# Patient Record
Sex: Female | Born: 1990 | Race: White | Hispanic: No | Marital: Single | Smoking: Never smoker
Health system: Southern US, Community
[De-identification: ages and names within clinical notes are randomized; demographics above are authoritative.]

## PROBLEM LIST (undated history)

## (undated) DIAGNOSIS — T7840XA Allergy, unspecified, initial encounter: Secondary | ICD-10-CM

## (undated) DIAGNOSIS — G43909 Migraine, unspecified, not intractable, without status migrainosus: Secondary | ICD-10-CM

## (undated) HISTORY — DX: Allergy, unspecified, initial encounter: T78.40XA

---

## 2012-07-08 ENCOUNTER — Ambulatory Visit (INDEPENDENT_AMBULATORY_CARE_PROVIDER_SITE_OTHER): Payer: 59 | Admitting: Family Medicine

## 2012-07-08 VITALS — BP 112/78 | HR 111 | Temp 98.5°F | Resp 16 | Ht 68.0 in | Wt 239.2 lb

## 2012-07-08 DIAGNOSIS — S0500XA Injury of conjunctiva and corneal abrasion without foreign body, unspecified eye, initial encounter: Secondary | ICD-10-CM

## 2012-07-08 DIAGNOSIS — S058X9A Other injuries of unspecified eye and orbit, initial encounter: Secondary | ICD-10-CM

## 2012-07-08 MED ORDER — DICLOFENAC SODIUM 0.1 % OP SOLN: 1.0000 [drp] | Freq: Four times a day (QID) | OPHTHALMIC | Status: DC

## 2012-07-08 MED ORDER — CIPROFLOXACIN HCL 0.3 % OP SOLN: 1.0000 [drp] | OPHTHALMIC | Status: DC

## 2012-07-08 NOTE — Progress Notes (Signed)
  Subjective:    Patient ID: Grace Long, female    DOB: September 30, 1990, 21 y.o.   MRN: [id number] Chief Complaint  Patient presents with  . Conjunctivitis    right eye    HPI  Right eye developed pain last night w/ rt frontal HA, has been watering but not draining anything. Did not get anything in eye.  + photophobia, no vision changes.  Does have pain at rest, worse with eye movement and with blinking. Usually wears contacts that she disposes of monthly as instructed.  Sometimes sleeps in them.  Took out her contacts yesterday and hasn't put them in since - wearing glasses.  Past Medical History  Diagnosis Date  . Allergy    Current Outpatient Prescriptions on File Prior to Visit  Medication Sig Dispense Refill  . Norgestimate-Ethinyl Estradiol Triphasic (TRI-SPRINTEC) 0.18/0.215/0.25 MG-35 MCG tablet Take 1 tablet by mouth daily.       Review of Systems  Constitutional: Negative for fever, chills, diaphoresis and activity change.  HENT: Negative for ear pain, congestion, sore throat, rhinorrhea, neck pain, neck stiffness, dental problem, sinus pressure and tinnitus.   Eyes: Positive for photophobia, pain, discharge and redness. Negative for itching and visual disturbance.  Neurological: Positive for headaches. Negative for dizziness, syncope, facial asymmetry, weakness, light-headedness and numbness.  Hematological: Negative for adenopathy.  Psychiatric/Behavioral: Negative for sleep disturbance.      BP 112/78  Pulse 111  Temp 98.5 F (36.9 C) (Oral)  Resp 16  Ht 5\' 8"  (1.727 m)  Wt 239 lb 3.2 oz (108.5 kg)  BMI 36.37 kg/m2  SpO2 98%  LMP 06/24/2012 Objective:   Physical Exam  Constitutional: She is oriented to person, place, and time. She appears well-developed and well-nourished. No distress.  HENT:  Head: Normocephalic and atraumatic.  Right Ear: External ear normal.  Eyes: EOM are normal. Pupils are equal, round, and reactive to light. No foreign bodies found. Right eye  exhibits discharge. Right eye exhibits no chemosis, no exudate and no hordeolum. No foreign body present in the right eye. Left eye exhibits no chemosis, no discharge and no exudate. Right conjunctiva is injected. Right conjunctiva has no hemorrhage. No scleral icterus.  Fundoscopic exam:      The right eye shows no hemorrhage and no papilledema.       The left eye shows no hemorrhage and no papilledema.  Slit lamp exam:      The right eye shows corneal abrasion and fluorescein uptake.       Right eye watery discharge, mild chemosis and injection of conjunctiva around medial and distal iris. Small pinpoint of fluorescein uptake in cornea along outer iris 4pm seen with magnification.  Pulmonary/Chest: Effort normal.  Neurological: She is alert and oriented to person, place, and time.  Skin: Skin is warm and dry. She is not diaphoretic. No erythema.  Psychiatric: She has a normal mood and affect. Her behavior is normal.      Assessment & Plan:   1. Corneal abrasion  ciprofloxacin (CILOXAN) 0.3 % ophthalmic solution, diclofenac (VOLTAREN) 0.1 % ophthalmic solution  No contacts, after healed discard contacts and use new pack.  Recheck in 2d to ensure healing and no ulceration.

## 2012-07-10 ENCOUNTER — Ambulatory Visit (INDEPENDENT_AMBULATORY_CARE_PROVIDER_SITE_OTHER): Payer: 59 | Admitting: Family Medicine

## 2012-07-10 VITALS — BP 100/72 | HR 86 | Temp 98.0°F | Resp 16 | Ht 68.0 in | Wt 238.0 lb

## 2012-07-10 DIAGNOSIS — S0500XA Injury of conjunctiva and corneal abrasion without foreign body, unspecified eye, initial encounter: Secondary | ICD-10-CM

## 2012-07-10 DIAGNOSIS — S058X9A Other injuries of unspecified eye and orbit, initial encounter: Secondary | ICD-10-CM

## 2012-07-10 NOTE — Progress Notes (Signed)
  Subjective:    Patient ID: Grace Long, female    DOB: May 31, 1991, 21 y.o.   MRN: [id number]  HPI  Grace Long is a delightful 21 yo female who I saw 2d prev with a small corneal abrasion.  The pain and discomfort is lessening though she is still having some photophobia.  She also feels like she has some pressure behind her eyes. No vision changes.  The diclofenac drops I gave her feels like it makes the pain worse - burning and more pressure after using them with no eventual improvement but doing fine on the antibiotic drops.  Past Medical History  Diagnosis Date  . Allergy      Review of Systems  Constitutional: Negative for fever, chills, diaphoresis and activity change.  HENT: Negative for ear pain, congestion, sore throat, rhinorrhea, neck pain, neck stiffness, dental problem, sinus pressure and tinnitus.   Eyes: Positive for photophobia, pain and itching. Negative for discharge, redness and visual disturbance.  Skin: Negative for rash.  Neurological: Negative for dizziness, syncope, facial asymmetry, weakness, light-headedness, numbness and headaches.  Hematological: Negative for adenopathy.  Psychiatric/Behavioral: Negative for sleep disturbance.      BP 100/72  Pulse 86  Temp 98 F (36.7 C) (Oral)  Resp 16  Ht 5\' 8"  (1.727 m)  Wt 238 lb (107.956 kg)  BMI 36.19 kg/m2  SpO2 99%  LMP 06/24/2012 Objective:   Physical Exam  Constitutional: She is oriented to person, place, and time. She appears well-developed and well-nourished. No distress.  HENT:  Head: Normocephalic and atraumatic.  Right Ear: External ear normal.  Eyes: EOM are normal. Pupils are equal, round, and reactive to light. Right eye exhibits no chemosis, no discharge and no exudate. Left eye exhibits no chemosis, no discharge and no exudate. Right conjunctiva is not injected. Left conjunctiva is not injected. No scleral icterus.  Fundoscopic exam:      The right eye shows no hemorrhage and no papilledema.   The left eye shows no hemorrhage and no papilledema.         Tiny point of collection on outer ring of iris approx 4 o'clock with very slight area of erythema along the iris from 3 to 6 o'clock. Both MUCH smaller and more subtle than prior exam.  Pulmonary/Chest: Effort normal.  Neurological: She is alert and oriented to person, place, and time.  Skin: Skin is warm and dry. She is not diaphoretic. No erythema.  Psychiatric: She has a normal mood and affect. Her behavior is normal.      Assessment & Plan:   1. Corneal abrasion   Much improved. Continue antibiotic drops for 5 additional days. RTC immed if any sxs persist or worsen. Ok to d/c nsaid drops.

## 2012-11-10 ENCOUNTER — Ambulatory Visit (INDEPENDENT_AMBULATORY_CARE_PROVIDER_SITE_OTHER): Payer: Managed Care, Other (non HMO) | Admitting: Emergency Medicine

## 2012-11-10 VITALS — BP 102/60 | HR 120 | Temp 98.3°F | Resp 20 | Ht 67.0 in | Wt 233.2 lb

## 2012-11-10 DIAGNOSIS — J039 Acute tonsillitis, unspecified: Secondary | ICD-10-CM

## 2012-11-10 MED ORDER — PENICILLIN V POTASSIUM 500 MG PO TABS: 500.0000 mg | ORAL_TABLET | Freq: Four times a day (QID) | ORAL | Status: DC

## 2012-11-10 NOTE — Progress Notes (Signed)
[facility] [address], [place] [address] [phone]  Date:  11/10/2012   Name:  Grace Long   DOB:  September 11, 1990   MRN:  [id number]  PCP:  No PCP Per Patient    Chief Complaint: Sore Throat and Mouth Lesions   History of Present Illness:  Grace Long is a 22 y.o. very pleasant female patient who presents with the following:  Ill since Saturday.  Has an intensely sore throat. Now has sores on the inside of her lips and denies cough or coryza.  No fever or chills.  No rash or nausea or vomiting.  Works in [employer].  No improvement with over the counter medications or other home remedies. Denies other complaint or health concern today.    There is no problem list on file for this patient.   Past Medical History  Diagnosis Date  . Allergy     History reviewed. No pertinent past surgical history.  History  Substance Use Topics  . Smoking status: Never Smoker   . Smokeless tobacco: Not on file  . Alcohol Use: No    Family History  Problem Relation Age of Onset  . Diabetes Maternal Grandmother     No Known Allergies  Medication list has been reviewed and updated.  Current Outpatient Prescriptions on File Prior to Visit  Medication Sig Dispense Refill  . Norgestimate-Ethinyl Estradiol Triphasic (TRI-SPRINTEC) 0.18/0.215/0.25 MG-35 MCG tablet Take 1 tablet by mouth daily.      . ciprofloxacin (CILOXAN) 0.3 % ophthalmic solution Place 1 drop into the right eye every 2 (two) hours. Administer 1 drop, every 2 hours, while awake, for 2 days. Then 1 drop, every 4 hours, while awake, for the next 5 days.  5 mL  0   No current facility-administered medications on file prior to visit.    Review of Systems:  As per HPI, otherwise negative.    Physical Examination: Filed Vitals:   11/10/12 1141  BP: 102/60  Pulse: 120  Temp: 98.3 F (36.8 C)  Resp: 20   Filed Vitals:   11/10/12 1141  Height: 5\' 7"  (1.702 m)  Weight: 233 lb 3.2 oz  (105.779 kg)   Body mass index is 36.52 kg/(m^2). Ideal Body Weight: Weight in (lb) to have BMI = 25: 159.3  GEN: WDWN, NAD, Non-toxic, A & O x 3 HEENT: Atraumatic, Normocephalic. Neck supple. No masses, anterior cervical LAD.  Exudative tonsillitis Ears and Nose: No external deformity.   CV: RRR, No M/G/R. No JVD. No thrill. No extra heart sounds. PULM: CTA B, no wheezes, crackles, rhonchi. No retractions. No resp. distress. No accessory muscle use. ABD: S, NT, ND, +BS. No rebound. No HSM. EXTR: No c/c/e NEURO Normal gait.  PSYCH: Normally interactive. Conversant. Not depressed or anxious appearing.  Calm demeanor.    Assessment and Plan: Tonsillitis Pen v k Follow up as needed   Signed,  Phillips Odor, MD

## 2012-11-10 NOTE — Patient Instructions (Addendum)
Tonsillitis Tonsils are lumps of lymphoid tissues at the back of the throat. Each tonsil has 20 crevices (crypts). Tonsils help fight nose and throat infections and keep infection from spreading to other parts of the body for the first 18 months of life. Tonsillitis is an infection of the throat that causes the tonsils to become red, tender, and swollen. CAUSES Sudden and, if treated, temporary (acute) tonsillitis is usually caused by infection with streptococcal bacteria. Long lasting (chronic) tonsillitis occurs when the crypts of the tonsils become filled with pieces of food and bacteria, which makes it easy for the tonsils to become constantly infected. SYMPTOMS  Symptoms of tonsillitis include:  A sore throat.  White patches on the tonsils.  Fever.  Tiredness. DIAGNOSIS Tonsillitis can be diagnosed through a physical exam. Diagnosis can be confirmed with the results of lab tests, including a throat culture. TREATMENT  The goals of tonsillitis treatment include the reduction of the severity and duration of symptoms, prevention of associated conditions, and prevention of disease transmission. Tonsillitis caused by bacteria can be treated with antibiotics. Usually, treatment with antibiotics is started before the cause of the tonsillitis is known. However, if it is determined that the cause is not bacterial, antibiotics will not treat the tonsillitis. If attacks of tonsillitis are severe and frequent, your caregiver may recommend surgery to remove the tonsils (tonsillectomy). HOME CARE INSTRUCTIONS   Rest as much as possible and get plenty of sleep.  Drink plenty of fluids. While the throat is very sore, eat soft foods or liquids, such as sherbet, soups, or instant breakfast drinks.  Eat frozen ice pops.  Older children and adults may gargle with a warm or cold liquid to help soothe the throat. Mix 1 teaspoon of salt in 1 cup of water.  Other family members who also develop a sore  throat or fever should have a medical exam or throat culture.  Only take over-the-counter or prescription medicines for pain, discomfort, or fever as directed by your caregiver.  If you are given antibiotics, take them as directed. Finish them even if you start to feel better. SEEK MEDICAL CARE IF:   Your baby is older than 3 months with a rectal temperature of 100.5 F (38.1 C) or higher for more than 1 day.  Large, tender lumps develop in your neck.  A rash develops.  Green, yellow-brown, or bloody substance is coughed up.  You are unable to swallow liquids or food for 24 hours.  Your child is unable to swallow food or liquids for 12 hours. SEEK IMMEDIATE MEDICAL CARE IF:   You develop any new symptoms such as vomiting, severe headache, stiff neck, chest pain, or trouble breathing or swallowing.  You have severe throat pain along with drooling or voice changes.  You have severe pain, unrelieved with recommended medications.  You are unable to fully open the mouth.  You develop redness, swelling, or severe pain anywhere in the neck.  You have a fever.  Your baby is older than 3 months with a rectal temperature of 102 F (38.9 C) or higher.  Your baby is 3 months old or younger with a rectal temperature of 100.4 F (38 C) or higher. MAKE SURE YOU:   Understand these instructions.  Will watch your condition.  Will get help right away if you are not doing well or get worse. Document Released: 04/18/2005 Document Revised: 10/01/2011 Document Reviewed: 09/14/2010 Exit[employer] Patient Information 2013 [employer].  

## 2012-11-12 ENCOUNTER — Telehealth: Payer: Self-pay

## 2012-11-12 NOTE — Telephone Encounter (Signed)
Pt was seen on Monday and diagnosed with tonsillitis and now has several sores on and around her mouth, would like to know what this could be from   Best number [phone]

## 2012-11-12 NOTE — Telephone Encounter (Signed)
Spoke with patient and she was diagnosed with toncilittis on Monday.  Now having sores in and around mouth.  Would like to know if this is a side effect of penicillin?  What should she do about them or is there something she can take for them?

## 2012-11-13 ENCOUNTER — Ambulatory Visit (INDEPENDENT_AMBULATORY_CARE_PROVIDER_SITE_OTHER): Payer: Managed Care, Other (non HMO) | Admitting: Physician Assistant

## 2012-11-13 VITALS — BP 103/62 | HR 130 | Temp 98.9°F | Resp 16 | Ht 67.0 in | Wt 229.0 lb

## 2012-11-13 DIAGNOSIS — R21 Rash and other nonspecific skin eruption: Secondary | ICD-10-CM

## 2012-11-13 DIAGNOSIS — K121 Other forms of stomatitis: Secondary | ICD-10-CM

## 2012-11-13 DIAGNOSIS — K1230 Oral mucositis (ulcerative), unspecified: Secondary | ICD-10-CM

## 2012-11-13 LAB — POCT CBC
Granulocyte percent: 60.6 %G (ref 37–80)
HCT, POC: 36 % — AB (ref 37.7–47.9)
Hemoglobin: 10.9 g/dL — AB (ref 12.2–16.2)
Lymph, poc: 2 (ref 0.6–3.4)
MCH, POC: 26.6 pg — AB (ref 27–31.2)
MCHC: 30.3 g/dL — AB (ref 31.8–35.4)
MCV: 87.8 fL (ref 80–97)
MID (cbc): 0.6 (ref 0–0.9)
MPV: 10.7 fL (ref 0–99.8)
POC Granulocyte: 4 (ref 2–6.9)
POC LYMPH PERCENT: 30.4 %L (ref 10–50)
POC MID %: 9 %M (ref 0–12)
Platelet Count, POC: 239 10*3/uL (ref 142–424)
RBC: 4.1 M/uL (ref 4.04–5.48)
RDW, POC: 14 %
WBC: 6.6 10*3/uL (ref 4.6–10.2)

## 2012-11-13 MED ORDER — FIRST-DUKES MOUTHWASH MT SUSP: 5.0000 mL | OROMUCOSAL | Status: DC | PRN

## 2012-11-13 MED ORDER — VALACYCLOVIR HCL 1 G PO TABS: 2000.0000 mg | ORAL_TABLET | Freq: Two times a day (BID) | ORAL | Status: DC

## 2012-11-13 NOTE — Telephone Encounter (Signed)
Called her to advise, she will come in this evening.

## 2012-11-13 NOTE — Telephone Encounter (Signed)
I would recommend a recheck - it could be from a virus or it is possible a reaction to the medication.

## 2012-11-13 NOTE — Progress Notes (Signed)
Subjective:    Patient ID: Grace Long, female    DOB: 03-02-1991, 22 y.o.   MRN: [id number]  HPI 22 year old female presents for recheck of tonsillitis.  Was seen here 11/10/12 and treated for tonsillitis and placed penicillin.  States her throat feels much better but now she has noticed multiple sores in her mouth, lips, and roof of her mouth.  Admits these are extremely uncomfortable and are so painful that she has been unable to eat.  Has taken ibuprofen and tylenol which has not helped at all. Denies fever, chills, nausea, vomiting, nasal congestion, otalgia, or headache. Does admit to neck lymph node swelling and extreme fatigue this week.   Patient is otherwise healthy with no other concerns today  She works at [employer] in [employer].     Review of Systems  Constitutional: Positive for fatigue. Negative for fever and chills.  HENT: Positive for trouble swallowing (painful due to sores). Negative for ear pain, congestion, sore throat, rhinorrhea and postnasal drip.   Respiratory: Negative for cough and shortness of breath.   Gastrointestinal: Negative for nausea, vomiting and abdominal pain.  Skin: Negative for rash.  Neurological: Negative for dizziness and headaches.       Objective:   Physical Exam  Constitutional: She is oriented to person, place, and time. She appears well-developed and well-nourished.  HENT:  Head: Normocephalic and atraumatic.  Right Ear: Hearing, tympanic membrane, external ear and ear canal normal.  Left Ear: Hearing, tympanic membrane, external ear and ear canal normal.  Mouth/Throat: Uvula is midline, oropharynx is clear and moist and mucous membranes are normal. Oral lesions (multiple ulcers; 4 on inside of lower lip, 1 inside upper lip, several on roof of mouth) present.  Eyes: Conjunctivae are normal.  Neck: Normal range of motion. Neck supple.  Cardiovascular: Normal rate.   Pulmonary/Chest: Effort normal.  Lymphadenopathy:    She has  cervical adenopathy (AC).  Neurological: She is alert and oriented to person, place, and time.  Psychiatric: She has a normal mood and affect. Her behavior is normal. Judgment and thought content normal.     Results for orders placed in visit on 11/13/12  POCT CBC      Result Value Range   WBC 6.6  4.6 - 10.2 K/uL   Lymph, poc 2.0  0.6 - 3.4   POC LYMPH PERCENT 30.4  10 - 50 %L   MID (cbc) 0.6  0 - 0.9   POC MID % 9.0  0 - 12 %M   POC Granulocyte 4.0  2 - 6.9   Granulocyte percent 60.6  37 - 80 %G   RBC 4.10  4.04 - 5.48 M/uL   Hemoglobin 10.9 (*) 12.2 - 16.2 g/dL   HCT, POC 69.6 (*) 29.5 - 47.9 %   MCV 87.8  80 - 97 fL   MCH, POC 26.6 (*) 27 - 31.2 pg   MCHC 30.3 (*) 31.8 - 35.4 g/dL   RDW, POC 28.4     Platelet Count, POC 239  142 - 424 K/uL   MPV 10.7  0 - 99.8 fL        Assessment & Plan:  Rash and nonspecific skin eruption - Plan: POCT CBC, Herpes simplex virus culture, HSV(herpes simplex vrs) 1+2 ab-IgG, Diphenhyd-Hydrocort-Nystatin (FIRST-DUKES MOUTHWASH) SUSP, valACYclovir (VALTREX) 1000 MG tablet  Stomatitis  Viral culture and HSV titers pending.  Patient did have a negative HIV test 2 months ago.  Continue penicillin for strep coverage.  Start Valtrex 2000 mg bid x 1 day, then 1000 mg bid until sores resolve Duke's Mouthwash q1-2hours prn pain.   Recommend OTC iron and multivitamin as she is slightly anemic today. She does think she has been told this before Follow up if symptoms worsen or fail to improve.

## 2012-11-16 LAB — HERPES SIMPLEX VIRUS CULTURE: Organism ID, Bacteria: DETECTED

## 2012-11-17 LAB — HSV(HERPES SIMPLEX VRS) I + II AB-IGG
HSV 1 Glycoprotein G Ab, IgG: 0.66 IV
HSV 2 Glycoprotein G Ab, IgG: 0.11 IV

## 2012-11-20 ENCOUNTER — Emergency Department (HOSPITAL_COMMUNITY)
Admission: EM | Admit: 2012-11-20 | Discharge: 2012-11-20 | Disposition: A | Discharge status: Home / Self Care | Payer: Managed Care, Other (non HMO) | Attending: Emergency Medicine | Admitting: Emergency Medicine

## 2012-11-20 ENCOUNTER — Ambulatory Visit: Payer: Managed Care, Other (non HMO)

## 2012-11-20 ENCOUNTER — Encounter (HOSPITAL_COMMUNITY): Payer: Self-pay | Admitting: *Deleted

## 2012-11-20 ENCOUNTER — Emergency Department (HOSPITAL_COMMUNITY): Payer: Managed Care, Other (non HMO)

## 2012-11-20 ENCOUNTER — Ambulatory Visit (INDEPENDENT_AMBULATORY_CARE_PROVIDER_SITE_OTHER): Payer: Managed Care, Other (non HMO) | Admitting: Emergency Medicine

## 2012-11-20 VITALS — BP 111/68 | HR 74 | Temp 98.3°F | Resp 17 | Ht 68.0 in | Wt 232.0 lb

## 2012-11-20 DIAGNOSIS — N201 Calculus of ureter: Secondary | ICD-10-CM | POA: Insufficient documentation

## 2012-11-20 DIAGNOSIS — R319 Hematuria, unspecified: Secondary | ICD-10-CM

## 2012-11-20 DIAGNOSIS — R109 Unspecified abdominal pain: Secondary | ICD-10-CM

## 2012-11-20 DIAGNOSIS — R112 Nausea with vomiting, unspecified: Secondary | ICD-10-CM | POA: Insufficient documentation

## 2012-11-20 LAB — POCT URINALYSIS DIPSTICK
Glucose, UA: NEGATIVE
Ketones, UA: NEGATIVE
Leukocytes, UA: NEGATIVE
Nitrite, UA: NEGATIVE
Protein, UA: 100
Spec Grav, UA: 1.025
Urobilinogen, UA: 0.2
pH, UA: 6

## 2012-11-20 LAB — URINALYSIS, ROUTINE W REFLEX MICROSCOPIC
Glucose, UA: NEGATIVE mg/dL
Leukocytes, UA: NEGATIVE
pH: 6.5 (ref 5.0–8.0)

## 2012-11-20 LAB — URINE MICROSCOPIC-ADD ON

## 2012-11-20 LAB — POCT URINE PREGNANCY: Preg Test, Ur: NEGATIVE

## 2012-11-20 MED ORDER — OXYCODONE-ACETAMINOPHEN 5-325 MG PO TABS: ORAL_TABLET | ORAL | Status: DC

## 2012-11-20 MED ORDER — KETOROLAC TROMETHAMINE 60 MG/2ML IM SOLN
60.0000 mg | Freq: Once | INTRAMUSCULAR | Status: AC
  Administered 2012-11-20: 60 mg via INTRAMUSCULAR

## 2012-11-20 MED ORDER — ONDANSETRON 4 MG PO TBDP
4.0000 mg | ORAL_TABLET | Freq: Once | ORAL | Status: AC
  Administered 2012-11-20: 4 mg via ORAL

## 2012-11-20 MED ORDER — SODIUM CHLORIDE 0.9 % IV SOLN
Freq: Once | INTRAVENOUS | Status: AC
  Administered 2012-11-20: 14:00:00 via INTRAVENOUS

## 2012-11-20 MED ORDER — HYDROCODONE-ACETAMINOPHEN 7.5-325 MG/15ML PO SOLN
15.0000 mL | Freq: Once | ORAL | Status: AC
  Administered 2012-11-20: 15 mL via ORAL

## 2012-11-20 MED ORDER — ONDANSETRON HCL 4 MG/2ML IJ SOLN
4.0000 mg | Freq: Once | INTRAMUSCULAR | Status: AC
  Administered 2012-11-20: 4 mg via INTRAVENOUS
  Filled 2012-11-20: qty 2

## 2012-11-20 MED ORDER — ONDANSETRON 4 MG PO TBDP: 4.0000 mg | ORAL_TABLET | Freq: Three times a day (TID) | ORAL | Status: DC | PRN

## 2012-11-20 MED ORDER — DEXTROSE 5 % IV SOLN
1.0000 g | Freq: Once | INTRAVENOUS | Status: AC
  Administered 2012-11-20: 1 g via INTRAVENOUS
  Filled 2012-11-20: qty 10

## 2012-11-20 MED ORDER — MORPHINE SULFATE 4 MG/ML IJ SOLN
4.0000 mg | Freq: Once | INTRAMUSCULAR | Status: AC
  Administered 2012-11-20: 4 mg via INTRAVENOUS
  Filled 2012-11-20: qty 1

## 2012-11-20 MED ORDER — TAMSULOSIN HCL 0.4 MG PO CAPS: ORAL_CAPSULE | ORAL | Status: DC

## 2012-11-20 MED ORDER — CEPHALEXIN 500 MG PO CAPS: 500.0000 mg | ORAL_CAPSULE | Freq: Three times a day (TID) | ORAL | Status: DC

## 2012-11-20 NOTE — ED Provider Notes (Signed)
History     CSN: [id number]  Arrival date & time 11/20/12  1228   First MD Initiated Contact with Patient 11/20/12 1259      Chief Complaint  Patient presents with  . Back Pain    (Consider location/radiation/quality/duration/timing/severity/associated sxs/prior treatment) HPI Patient reports yesterday after she urinated she saw some blood on the toilet paper when she wiped. She states this morning about 8:30 AM she had acute onset of right flank pain which sometimes radiates into her right lateral abdomen. She states the pain has been constant and is sharp. She states deep breathing makes it hurt more. Nothing makes it feel better. She has had nausea and vomiting. She denies dysuria, frequency, or fever. She does report decreased urinary output. She states she's never had this pain before. Patient states her last normal period started a week ago and she is on birth control pills. Patient was seen at the urgent care on [address]  was given hydrocodone liquid which she vomited. She was also given Toradol 60 mg IM and Zofran ODT. She states it improved her pain from a level 10/10  to a current level 5/10. She states her boyfriend lives in [place] so they came to the emergency department here.  She reports her father had kidney stones.  PCP urgent care on [address]  Past Medical History  Diagnosis Date  . Allergy     History reviewed. No pertinent past surgical history.  Family History  Problem Relation Age of Onset  . Diabetes Maternal Grandmother   FOP kidney stones  History  Substance Use Topics  . Smoking status: Never Smoker   . Smokeless tobacco: Not on file  . Alcohol Use: No   Employed in [place] Lives in [place], boyfriend lives in [place]  [place] History   Grav Para Term Preterm Abortions TAB SAB Ect Mult Living                  Review of Systems  All other systems reviewed and are negative.    Allergies  Review of patient's allergies indicates no  known allergies.  Home Medications   Current Outpatient Rx  Name  Route  Sig  Dispense  Refill  . Norgestimate-Ethinyl Estradiol Triphasic (TRI-SPRINTEC) 0.18/0.215/0.25 MG-35 MCG tablet   Oral   Take 1 tablet by mouth daily.         . SUMAtriptan (IMITREX) 100 MG tablet   Oral   Take 100 mg by mouth every 2 (two) hours as needed for migraine.           BP 102/79  Pulse 67  Temp(Src) 97.2 F (36.2 C) (Oral)  Resp 18  Ht 5\' 8"  (1.727 m)  Wt 232 lb (105.235 kg)  BMI 35.28 kg/m2  SpO2 100%  LMP 11/14/2012  Vital signs normal    Physical Exam  Nursing note and vitals reviewed. Constitutional: She is oriented to person, place, and time. She appears well-developed and well-nourished.  Non-toxic appearance. She does not appear ill.  Appears uncomfortable  HENT:  Head: Normocephalic and atraumatic.  Right Ear: External ear normal.  Left Ear: External ear normal.  Nose: Nose normal. No mucosal edema or rhinorrhea.  Mouth/Throat: Oropharynx is clear and moist and mucous membranes are normal. No dental abscesses or edematous.  Eyes: Conjunctivae and EOM are normal. Pupils are equal, round, and reactive to light.  Neck: Normal range of motion and full passive range of motion without pain. Neck supple.  Cardiovascular: Normal  rate, regular rhythm and normal heart sounds.  Exam reveals no gallop and no friction rub.   No murmur heard. Pulmonary/Chest: Effort normal and breath sounds normal. No respiratory distress. She has no wheezes. She has no rhonchi. She has no rales. She exhibits no tenderness and no crepitus.  Abdominal: Soft. Normal appearance and bowel sounds are normal. She exhibits no distension. There is no tenderness. There is no rebound and no guarding.    Area of pain noted  Musculoskeletal: Normal range of motion. She exhibits no edema and no tenderness.       Back:  Moves all extremities well.   Neurological: She is alert and oriented to person, place, and  time. She has normal strength. No cranial nerve deficit.  Skin: Skin is warm, dry and intact. No rash noted. No erythema. There is pallor.  Psychiatric: She has a normal mood and affect. Her speech is normal and behavior is normal. Her mood appears not anxious.    ED Course  Procedures (including critical care time)  Medications  0.9 %  sodium chloride infusion ( Intravenous Stopped 11/20/12 1639)  morphine 4 MG/ML injection 4 mg (4 mg Intravenous Given 11/20/12 1341)  ondansetron (ZOFRAN) injection 4 mg (4 mg Intravenous Given 11/20/12 1341)  cefTRIAXone (ROCEPHIN) 1 g in dextrose 5 % 50 mL IVPB (0 g Intravenous Stopped 11/20/12 1639)   At time of discharge patient states her pain was gone.  Results for orders placed during the hospital encounter of 11/20/12  URINALYSIS, ROUTINE W REFLEX MICROSCOPIC      Result Value Range   Color, Urine YELLOW  YELLOW   APPearance CLEAR  CLEAR   Specific Gravity, Urine <1.005 (*) 1.005 - 1.030   pH 6.5  5.0 - 8.0   Glucose, UA NEGATIVE  NEGATIVE mg/dL   Hgb urine dipstick LARGE (*) NEGATIVE   Bilirubin Urine NEGATIVE  NEGATIVE   Ketones, ur NEGATIVE  NEGATIVE mg/dL   Protein, ur NEGATIVE  NEGATIVE mg/dL   Urobilinogen, UA 0.2  0.0 - 1.0 mg/dL   Nitrite NEGATIVE  NEGATIVE   Leukocytes, UA NEGATIVE  NEGATIVE  URINE MICROSCOPIC-ADD ON      Result Value Range   Squamous Epithelial / LPF FEW (*) RARE   WBC, UA 0-2  <3 WBC/hpf   RBC / HPF 3-6  <3 RBC/hpf   Bacteria, UA FEW (*) RARE   Laboratory interpretation all normal except hematuria   Ct Abdomen Pelvis Wo Contrast  11/20/2012  *RADIOLOGY REPORT*  Clinical Data: Right flank pain, hematuria  CT ABDOMEN AND PELVIS WITHOUT CONTRAST  Technique:  Multidetector CT imaging of the abdomen and pelvis was performed following the standard protocol without intravenous contrast.  Comparison: None.  Findings: Visualized lung bases appear normal.  These unenhanced images demonstrate no focal abnormality of the  liver, spleen or pancreas.  No gallstones are noted.  Adrenal glands appear normal. Minimal right hydroureternephrosis is noted secondary to a calculus measuring 1-2 mm at the right ureterovesicle junction. Right renal margins are indistinct suggesting pyelonephritis.  Small nonobstructive calculus measuring 1 mm is noted in the mid pole collecting system of the left kidney.  The appendix is visualized and appears normal.  No gross bowel abnormality is noted.  Urinary bladder appears normal.  Uterus appears normal.  No abnormal fluid collection is noted.  2.7 cm right ovarian cyst is noted which most likely is physiologic.  IMPRESSION: Small nonobstructive left renal calculus.  Minimal right hydroureteronephrosis is noted secondary  to 1 to 2 mm calculus at the right ureterovesical junction.  Right kidney is slightly enlarged and indistinct compared to the left suggesting pyelonephritis.   Original Report Authenticated By: Lupita Raider.,  M.D.    Dg Abd 1 View  11/20/2012  *RADIOLOGY REPORT*  Clinical Data: Abdominal pain, acute onset of right flank pain and possible hematuria  ABDOMEN - 1 VIEW  Comparison: 06/20/2011  Findings: Inferior pelvis excluded. No urinary tract calcification identified. Bowel gas pattern normal. Bones unremarkable.  IMPRESSION: Normal exam.   Original Report Authenticated By: Ulyses Southward, M.D.       1. Ureteral calculus, right     Discharge Medication List as of 11/20/2012  4:17 PM    START taking these medications   Details  cephALEXin (KEFLEX) 500 MG capsule Take 1 capsule (500 mg total) by mouth 3 (three) times daily., Starting 11/20/2012, Until Discontinued, Print    ondansetron (ZOFRAN ODT) 4 MG disintegrating tablet Take 1 tablet (4 mg total) by mouth every 8 (eight) hours as needed for nausea., Starting 11/20/2012, Until Discontinued, Print    oxyCODONE-acetaminophen (PERCOCET/ROXICET) 5-325 MG per tablet Take 1 or 2 po Q 6hrs for pain, Print    tamsulosin (FLOMAX) 0.4  MG CAPS Take 1 po QD until you pass the stone., Print        Plan discharge  Devoria Albe, MD, FACEP   MDM  patient with history consistent with ureteral stone. The stone is small on CT and patient should be able to pass it on her own. However the CT scan also was suggestive of early pyelonephritis in her right kidney although her UA did not have a lot of WBCs present, patient was started on antibiotics.           Ward Givens, MD 11/20/12 1739

## 2012-11-20 NOTE — ED Notes (Signed)
Sent from Urgent care for evaluation of possible kidney stone.  Pain rt side of back and N/v.    Given toradol 60 mg IM  At Urgent care, and zofran po.    Given 15 ml of hydrocodone po , but vomited that .

## 2012-11-20 NOTE — Progress Notes (Signed)
  Subjective:    Patient ID: Grace Long, female    DOB: 1990-12-15, 22 y.o.   MRN: [id number]  HPI 22 year old female presents with acute onset of right flank pain and possible hematuria.  Symptoms started suddenly this morning and have progressively worsened.  She has 10/10 pain that is sharp and stabbing.  Complains of some abdominal pain on the right side.  Nausea with 2 episodes of emesis today.  Denies dysuria or frequency but does admit to possible hematuria.  No history of kidney stones but her father did have one in the past.  LNMP 1 week ago and she does use OCP.  She is otherwise heathy with no other concerns today.     Review of Systems  Constitutional: Negative for fever and chills.  Gastrointestinal: Positive for nausea, vomiting and abdominal pain.  Genitourinary: Positive for hematuria and flank pain. Negative for dysuria and frequency.  Neurological: Negative for headaches.       Objective:   Physical Exam  Constitutional: She is oriented to person, place, and time. She appears well-developed and well-nourished.  HENT:  Head: Normocephalic and atraumatic.  Right Ear: External ear normal.  Left Ear: External ear normal.  Eyes: Conjunctivae are normal.  Neck: Normal range of motion.  Cardiovascular: Normal rate, regular rhythm and normal heart sounds.   Pulmonary/Chest: Effort normal and breath sounds normal.  Abdominal: Soft. Bowel sounds are normal. There is tenderness (right side). There is CVA tenderness (right sided). There is no rebound and no guarding.  Neurological: She is alert and oriented to person, place, and time.  Psychiatric: She has a normal mood and affect. Her behavior is normal. Judgment and thought content normal.      Results for orders placed in visit on 11/20/12  POCT URINALYSIS DIPSTICK      Result Value Range   Color, UA orange     Clarity, UA cloudy     Glucose, UA neg     Bilirubin, UA small     Ketones, UA neg     Spec Grav, UA 1.025      Blood, UA large     pH, UA 6.0     Protein, UA 100     Urobilinogen, UA 0.2     Nitrite, UA neg     Leukocytes, UA Negative    POCT URINE PREGNANCY      Result Value Range   Preg Test, Ur Negative     Lortab 7.5/325 mg given in office. Patient vomited this medication Toradol 60 mg IM given. Pain reduced to 6/10 s/p toradol injection.   [facility] reading (PRIMARY) by  Dr. Cleta Alberts as no obvious stone noted.    Assessment & Plan:  Hematuria - Plan: POCT urinalysis dipstick, CANCELED: POCT UA - Microscopic Only  Flank pain - Plan: POCT urinalysis dipstick, HYDROcodone-acetaminophen (HYCET) 7.5-325 mg/15 ml solution 15 mL, ketorolac (TORADOL) injection 60 mg, CANCELED: POCT UA - Microscopic Only  Abdominal pain, unspecified site - Plan: DG Abd 1 View, POCT urine pregnancy  Nausea with vomiting - Plan: POCT urine pregnancy, ondansetron (ZOFRAN-ODT) disintegrating tablet 4 mg  Patient sent to [facility] ER (per patient request) via private vehicle for further evaluation and treatment Unable to obtain optimal pain control here in the office so will send to ER.   Follow up here if needed.

## 2012-11-20 NOTE — Patient Instructions (Addendum)
Patient to go to ER for further evaluation and treatment for possible kidney stone

## 2012-11-20 NOTE — ED Notes (Signed)
Denies nausea at this time. C/o 5/10 pain with some relief from pain meds received from Urgent Care.

## 2012-11-20 NOTE — ED Notes (Signed)
Waiting for completion of IV abx before being discharged.

## 2013-03-04 ENCOUNTER — Ambulatory Visit (INDEPENDENT_AMBULATORY_CARE_PROVIDER_SITE_OTHER): Payer: Managed Care, Other (non HMO) | Admitting: Family Medicine

## 2013-03-04 ENCOUNTER — Telehealth: Payer: Self-pay | Admitting: Family Medicine

## 2013-03-04 VITALS — BP 118/64 | HR 89 | Temp 98.0°F | Resp 18 | Ht 68.0 in | Wt 240.0 lb

## 2013-03-04 DIAGNOSIS — H1089 Other conjunctivitis: Secondary | ICD-10-CM

## 2013-03-04 NOTE — Progress Notes (Signed)
Subjective:    Patient ID: Grace Long, female    DOB: [birth year], 22 y.o.   MRN: [id number] Chief Complaint  Patient presents with  . left eye irritation    since this morning    HPI  Woke up this morning and put her contacts in - immed had a lot of pain in her left eye so took contacts out and worse her glasses.  Thought it was just pink eye but no crusting, lots of watering, + photophobia - doesn't think she is having any vision changes - thinks it is just her glasses are old.  However, she has had increasing globus pressure throughout the day, now very severe, hard to open her eye at all.  Past Medical History  Diagnosis Date  . Allergy    Current Outpatient Prescriptions on File Prior to Visit  Medication Sig Dispense Refill  . cephALEXin (KEFLEX) 500 MG capsule Take 1 capsule (500 mg total) by mouth 3 (three) times daily.  30 capsule  0  . Norgestimate-Ethinyl Estradiol Triphasic (TRI-SPRINTEC) 0.18/0.215/0.25 MG-35 MCG tablet Take 1 tablet by mouth daily.      . ondansetron (ZOFRAN ODT) 4 MG disintegrating tablet Take 1 tablet (4 mg total) by mouth every 8 (eight) hours as needed for nausea.  8 tablet  0  . oxyCODONE-acetaminophen (PERCOCET/ROXICET) 5-325 MG per tablet Take 1 or 2 po Q 6hrs for pain  15 tablet  0  . penicillin v potassium (VEETID) 500 MG tablet Take 1 tablet (500 mg total) by mouth 4 (four) times daily.  40 tablet  0  . SUMAtriptan (IMITREX) 100 MG tablet Take 100 mg by mouth every 2 (two) hours as needed for migraine.      . tamsulosin (FLOMAX) 0.4 MG CAPS Take 1 po QD until you pass the stone.  10 capsule  0  . valACYclovir (VALTREX) 1000 MG tablet Take 2 tablets (2,000 mg total) by mouth 2 (two) times daily.  20 tablet  0   No current facility-administered medications on file prior to visit.   No Known Allergies   Review of Systems  Constitutional: Negative for fever, chills, diaphoresis, activity change and appetite change.  Eyes: Positive for  photophobia, pain, discharge, redness and visual disturbance. Negative for itching.       Tearing  Neurological: Positive for headaches.      BP 118/64  Pulse 89  Temp(Src) 98 F (36.7 C) (Oral)  Resp 18  Ht 5\' 8"  (1.727 m)  Wt 240 lb (108.863 kg)  BMI 36.5 kg/m2  SpO2 99%  LMP 02/11/2013 Objective:   Physical Exam  Eyes: Right eye exhibits no chemosis, no discharge, no exudate and no hordeolum. No foreign body present in the right eye. Left eye exhibits chemosis. Left eye exhibits no discharge, no exudate and no hordeolum. No foreign body present in the left eye. Right conjunctiva is not injected. Right conjunctiva has no hemorrhage. Left conjunctiva is injected. Left conjunctiva has no hemorrhage. No scleral icterus. Right eye exhibits normal extraocular motion and no nystagmus. Left eye exhibits normal extraocular motion. Right pupil is round and reactive. Left pupil is round and reactive. Pupils are unequal.  Fundoscopic exam:      The right eye shows no arteriolar narrowing, no AV nicking, no exudate, no hemorrhage and no papilledema. The right eye shows no red reflex.  Slit lamp exam:      The left eye shows no corneal abrasion, no corneal ulcer, no foreign body and no  fluorescein uptake.  Anisocoria with left pupil smaller than right but it is not fixed.  Normal fundoscopic exam on right but unable to get adequate exam on left due to severe photophobia. Left lids slightly swollen      Assessment & Plan:  Likely irritation from contact use but I am concerned about here severe globus pressure and mild anisocoria so will ask the pt have optho eval today. Refer to Dr. Laruth Bouchard office.

## 2013-03-04 NOTE — Telephone Encounter (Signed)
ASHLEY FROM DR GROAT'S OFFICE STATES WE REFERRED PT OVER AND THEY ARE IN NEED OF HIS RECORDS PT IS IN OFFICE NOW  PLEASE FAX TO [phone] AND THE PHONE NUMBER IS [phone]

## 2013-10-28 ENCOUNTER — Emergency Department (HOSPITAL_COMMUNITY): Payer: Managed Care, Other (non HMO)

## 2013-10-28 ENCOUNTER — Ambulatory Visit (INDEPENDENT_AMBULATORY_CARE_PROVIDER_SITE_OTHER): Payer: Managed Care, Other (non HMO) | Admitting: Emergency Medicine

## 2013-10-28 ENCOUNTER — Emergency Department (HOSPITAL_COMMUNITY)
Admission: EM | Admit: 2013-10-28 | Discharge: 2013-10-28 | Disposition: A | Discharge status: Home / Self Care | Payer: Managed Care, Other (non HMO) | Attending: Emergency Medicine | Admitting: Emergency Medicine

## 2013-10-28 ENCOUNTER — Encounter (HOSPITAL_COMMUNITY): Payer: Self-pay | Admitting: Emergency Medicine

## 2013-10-28 VITALS — BP 100/60 | HR 105 | Temp 98.7°F | Resp 16 | Ht 68.0 in | Wt 249.0 lb

## 2013-10-28 DIAGNOSIS — Z792 Long term (current) use of antibiotics: Secondary | ICD-10-CM | POA: Insufficient documentation

## 2013-10-28 DIAGNOSIS — R1013 Epigastric pain: Secondary | ICD-10-CM | POA: Insufficient documentation

## 2013-10-28 DIAGNOSIS — R109 Unspecified abdominal pain: Secondary | ICD-10-CM

## 2013-10-28 DIAGNOSIS — R1011 Right upper quadrant pain: Secondary | ICD-10-CM | POA: Insufficient documentation

## 2013-10-28 DIAGNOSIS — R110 Nausea: Secondary | ICD-10-CM | POA: Insufficient documentation

## 2013-10-28 DIAGNOSIS — Z3202 Encounter for pregnancy test, result negative: Secondary | ICD-10-CM | POA: Insufficient documentation

## 2013-10-28 DIAGNOSIS — Z79899 Other long term (current) drug therapy: Secondary | ICD-10-CM | POA: Insufficient documentation

## 2013-10-28 DIAGNOSIS — R1031 Right lower quadrant pain: Secondary | ICD-10-CM | POA: Insufficient documentation

## 2013-10-28 LAB — POCT CBC
Granulocyte percent: 76.9 %G (ref 37–80)
HEMATOCRIT: 40.6 % (ref 37.7–47.9)
HEMOGLOBIN: 12.6 g/dL (ref 12.2–16.2)
LYMPH, POC: 2.2 (ref 0.6–3.4)
MCH: 27.8 pg (ref 27–31.2)
MCHC: 31 g/dL — AB (ref 31.8–35.4)
MCV: 89.4 fL (ref 80–97)
MID (cbc): 0.6 (ref 0–0.9)
MPV: 11.6 fL (ref 0–99.8)
POC Granulocyte: 9.2 — AB (ref 2–6.9)
POC LYMPH %: 18.1 % (ref 10–50)
POC MID %: 5 % (ref 0–12)
Platelet Count, POC: 263 10*3/uL (ref 142–424)
RBC: 4.54 M/uL (ref 4.04–5.48)
RDW, POC: 13.8 %
WBC: 11.9 10*3/uL — AB (ref 4.6–10.2)

## 2013-10-28 LAB — POCT UA - MICROSCOPIC ONLY
BACTERIA, U MICROSCOPIC: NEGATIVE
CRYSTALS, UR, HPF, POC: NEGATIVE
Casts, Ur, LPF, POC: NEGATIVE
Mucus, UA: POSITIVE
RBC, URINE, MICROSCOPIC: NEGATIVE
Yeast, UA: NEGATIVE

## 2013-10-28 LAB — CBC WITH DIFFERENTIAL/PLATELET
BASOS PCT: 0 % (ref 0–1)
Basophils Absolute: 0 10*3/uL (ref 0.0–0.1)
EOS ABS: 0 10*3/uL (ref 0.0–0.7)
EOS PCT: 0 % (ref 0–5)
HEMATOCRIT: 37.8 % (ref 36.0–46.0)
HEMOGLOBIN: 12.2 g/dL (ref 12.0–15.0)
Lymphocytes Relative: 9 % — ABNORMAL LOW (ref 12–46)
Lymphs Abs: 1.1 10*3/uL (ref 0.7–4.0)
MCH: 27.8 pg (ref 26.0–34.0)
MCHC: 32.3 g/dL (ref 30.0–36.0)
MCV: 86.1 fL (ref 78.0–100.0)
MONO ABS: 0.9 10*3/uL (ref 0.1–1.0)
MONOS PCT: 7 % (ref 3–12)
Neutro Abs: 9.9 10*3/uL — ABNORMAL HIGH (ref 1.7–7.7)
Neutrophils Relative %: 84 % — ABNORMAL HIGH (ref 43–77)
Platelets: 243 10*3/uL (ref 150–400)
RBC: 4.39 MIL/uL (ref 3.87–5.11)
RDW: 13.4 % (ref 11.5–15.5)
WBC: 11.9 10*3/uL — ABNORMAL HIGH (ref 4.0–10.5)

## 2013-10-28 LAB — POCT URINALYSIS DIPSTICK
BILIRUBIN UA: NEGATIVE
GLUCOSE UA: NEGATIVE
Ketones, UA: NEGATIVE
Leukocytes, UA: NEGATIVE
Nitrite, UA: NEGATIVE
PH UA: 5.5
Protein, UA: NEGATIVE
RBC UA: NEGATIVE
SPEC GRAV UA: 1.025
Urobilinogen, UA: 0.2

## 2013-10-28 LAB — URINALYSIS, ROUTINE W REFLEX MICROSCOPIC
BILIRUBIN URINE: NEGATIVE
Glucose, UA: NEGATIVE mg/dL
Hgb urine dipstick: NEGATIVE
KETONES UR: NEGATIVE mg/dL
NITRITE: NEGATIVE
Protein, ur: NEGATIVE mg/dL
SPECIFIC GRAVITY, URINE: 1.024 (ref 1.005–1.030)
UROBILINOGEN UA: 1 mg/dL (ref 0.0–1.0)
pH: 7 (ref 5.0–8.0)

## 2013-10-28 LAB — URINE MICROSCOPIC-ADD ON

## 2013-10-28 LAB — COMPREHENSIVE METABOLIC PANEL
ALBUMIN: 3.7 g/dL (ref 3.5–5.2)
ALT: 11 U/L (ref 0–35)
AST: 13 U/L (ref 0–37)
Alkaline Phosphatase: 61 U/L (ref 39–117)
BILIRUBIN TOTAL: 0.4 mg/dL (ref 0.3–1.2)
BUN: 11 mg/dL (ref 6–23)
CHLORIDE: 102 meq/L (ref 96–112)
CO2: 23 mEq/L (ref 19–32)
CREATININE: 0.54 mg/dL (ref 0.50–1.10)
Calcium: 9.4 mg/dL (ref 8.4–10.5)
GFR calc Af Amer: >90 mL/min (ref >90)
GFR calc non Af Amer: >90 mL/min (ref >90)
Glucose, Bld: 92 mg/dL (ref 70–99)
Potassium: 3.8 mEq/L (ref 3.7–5.3)
Sodium: 138 mEq/L (ref 137–147)
TOTAL PROTEIN: 7.7 g/dL (ref 6.0–8.3)

## 2013-10-28 LAB — POCT URINE PREGNANCY: Preg Test, Ur: NEGATIVE

## 2013-10-28 LAB — LIPASE, BLOOD: LIPASE: 23 U/L (ref 11–59)

## 2013-10-28 LAB — POC URINE PREG, ED: PREG TEST UR: NEGATIVE

## 2013-10-28 MED ORDER — CIPROFLOXACIN HCL 500 MG PO TABS: 500.0000 mg | ORAL_TABLET | Freq: Two times a day (BID) | ORAL | Status: DC

## 2013-10-28 MED ORDER — METRONIDAZOLE 500 MG PO TABS: 500.0000 mg | ORAL_TABLET | Freq: Two times a day (BID) | ORAL | Status: DC

## 2013-10-28 MED ORDER — IOHEXOL 300 MG/ML  SOLN
50.0000 mL | Freq: Once | INTRAMUSCULAR | Status: AC | PRN
  Administered 2013-10-28: 50 mL via ORAL

## 2013-10-28 MED ORDER — SODIUM CHLORIDE 0.9 % IV BOLUS (SEPSIS)
1000.0000 mL | Freq: Once | INTRAVENOUS | Status: AC
  Administered 2013-10-28: 1000 mL via INTRAVENOUS

## 2013-10-28 MED ORDER — IOHEXOL 300 MG/ML  SOLN
100.0000 mL | Freq: Once | INTRAMUSCULAR | Status: AC | PRN
  Administered 2013-10-28: 100 mL via INTRAVENOUS

## 2013-10-28 MED ORDER — GI COCKTAIL ~~LOC~~
30.0000 mL | Freq: Once | ORAL | Status: AC
  Administered 2013-10-28: 30 mL via ORAL
  Filled 2013-10-28: qty 30

## 2013-10-28 NOTE — ED Provider Notes (Signed)
Medical screening examination/treatment/procedure(s) were performed by non-physician practitioner and as supervising physician I was immediately available for consultation/collaboration.   EKG Interpretation None       Nettye Flegal R. Ashten Sarnowski, MD 10/28/13 2334 

## 2013-10-28 NOTE — Discharge Instructions (Signed)
Abdominal Pain, Adult °Many things can cause abdominal pain. Usually, abdominal pain is not caused by a disease and will improve without treatment. It can often be observed and treated at home. Your health care provider will do a physical exam and possibly order blood tests and X-rays to help determine the seriousness of your pain. However, in many cases, more time must pass before a clear cause of the pain can be found. Before that point, your health care provider may not know if you need more testing or further treatment. °HOME CARE INSTRUCTIONS  °Monitor your abdominal pain for any changes. The following actions may help to alleviate any discomfort you are experiencing: °· Only take over-the-counter or prescription medicines as directed by your health care provider. °· Do not take laxatives unless directed to do so by your health care provider. °· Try a clear liquid diet (broth, tea, or water) as directed by your health care provider. Slowly move to a bland diet as tolerated. °SEEK MEDICAL CARE IF: °· You have unexplained abdominal pain. °· You have abdominal pain associated with nausea or diarrhea. °· You have pain when you urinate or have a bowel movement. °· You experience abdominal pain that wakes you in the night. °· You have abdominal pain that is worsened or improved by eating food. °· You have abdominal pain that is worsened with eating fatty foods. °SEEK IMMEDIATE MEDICAL CARE IF:  °· Your pain does not go away within 2 hours. °· You have a fever. °· You keep throwing up (vomiting). °· Your pain is felt only in portions of the abdomen, such as the right side or the left lower portion of the abdomen. °· You pass bloody or black tarry stools. °MAKE SURE YOU: °· Understand these instructions.   °· Will watch your condition.   °· Will get help right away if you are not doing well or get worse.   °Document Released: 04/18/2005 Document Revised: 04/29/2013 Document Reviewed: 03/18/2013 °[employer]® Patient  Information ©2014 [employer]. ° °

## 2013-10-28 NOTE — ED Notes (Signed)
Pt c/o upper and RLQ pain since this morning.  Denies NVD.

## 2013-10-28 NOTE — ED Provider Notes (Signed)
CSN: [id number]     Arrival date & time 10/28/13  1354 History   First MD Initiated Contact with Patient 10/28/13 1505     Chief Complaint  Patient presents with  . Abdominal Pain     (Consider location/radiation/quality/duration/timing/severity/associated sxs/prior Treatment) HPI Comments: Patient is a 23 year old otherwise healthy female who presents today with sudden onset abdominal pain since this morning. She reports she was getting ready for work around 7am when the pain began. It is the worst in her right upper quadrant and radiates into her right lower quadrant. It is a sharp pain. She has associated nausea without vomiting or diarrhea. She denies anorexia or fever. She denies any abdominal surgeries. No prior hx of issues with her gallbladder. She denies urinary symptoms including dysuria, urinary urgency, urinary frequency.   Patient is a 23 y.o. female presenting with abdominal pain. The history is provided by the patient. No language interpreter was used.  Abdominal Pain Associated symptoms: nausea   Associated symptoms: no chest pain, no chills, no diarrhea, no fever, no shortness of breath and no vomiting     Past Medical History  Diagnosis Date  . Allergy    No past surgical history on file. Family History  Problem Relation Age of Onset  . Diabetes Maternal Grandmother    History  Substance Use Topics  . Smoking status: Never Smoker   . Smokeless tobacco: Not on file  . Alcohol Use: No   OB History   Grav Para Term Preterm Abortions TAB SAB Ect Mult Living                 Review of Systems  Constitutional: Negative for fever and chills.  Respiratory: Negative for shortness of breath.   Cardiovascular: Negative for chest pain.  Gastrointestinal: Positive for nausea and abdominal pain. Negative for vomiting and diarrhea.  All other systems reviewed and are negative.     Allergies  Review of patient's allergies indicates no known allergies.  Home  Medications   Current Outpatient Rx  Name  Route  Sig  Dispense  Refill  . cephALEXin (KEFLEX) 500 MG capsule   Oral   Take 1 capsule (500 mg total) by mouth 3 (three) times daily.   30 capsule   0   . etonogestrel (NEXPLANON) 68 MG IMPL implant   Subcutaneous   Inject 1 each into the skin once.         . Norgestimate-Ethinyl Estradiol Triphasic (TRI-SPRINTEC) 0.18/0.215/0.25 MG-35 MCG tablet   Oral   Take 1 tablet by mouth daily.         . ondansetron (ZOFRAN ODT) 4 MG disintegrating tablet   Oral   Take 1 tablet (4 mg total) by mouth every 8 (eight) hours as needed for nausea.   8 tablet   0   . oxyCODONE-acetaminophen (PERCOCET/ROXICET) 5-325 MG per tablet      Take 1 or 2 po Q 6hrs for pain   15 tablet   0   . penicillin v potassium (VEETID) 500 MG tablet   Oral   Take 1 tablet (500 mg total) by mouth 4 (four) times daily.   40 tablet   0   . SUMAtriptan (IMITREX) 100 MG tablet   Oral   Take 100 mg by mouth every 2 (two) hours as needed for migraine.         . tamsulosin (FLOMAX) 0.4 MG CAPS      Take 1 po QD until you  pass the stone.   10 capsule   0   . valACYclovir (VALTREX) 1000 MG tablet   Oral   Take 2 tablets (2,000 mg total) by mouth 2 (two) times daily.   20 tablet   0    BP 115/79  Pulse 96  Temp(Src) 97.8 F (36.6 C) (Oral)  Resp 18  SpO2 100% Physical Exam  Nursing note and vitals reviewed. Constitutional: She is oriented to person, place, and time. She appears well-developed and well-nourished. No distress.  HENT:  Head: Normocephalic and atraumatic.  Right Ear: External ear normal.  Left Ear: External ear normal.  Nose: Nose normal.  Mouth/Throat: Oropharynx is clear and moist.  Eyes: Conjunctivae are normal.  Neck: Normal range of motion.  Cardiovascular: Normal rate, regular rhythm and normal heart sounds.   Pulmonary/Chest: Effort normal and breath sounds normal. No stridor. No respiratory distress. She has no wheezes.  She has no rales.  Abdominal: Soft. She exhibits no distension. There is tenderness in the right upper quadrant, right lower quadrant and epigastric area. There is no rigidity, no rebound and no guarding.  Musculoskeletal: Normal range of motion.  Neurological: She is alert and oriented to person, place, and time. She has normal strength.  Skin: Skin is warm and dry. She is not diaphoretic. No erythema.  Psychiatric: She has a normal mood and affect. Her behavior is normal.    ED Course  Procedures (including critical care time) Labs Review Labs Reviewed  CBC WITH DIFFERENTIAL - Abnormal; Notable for the following:    WBC 11.9 (*)    Neutrophils Relative % 84 (*)    Neutro Abs 9.9 (*)    Lymphocytes Relative 9 (*)    All other components within normal limits  URINALYSIS, ROUTINE W REFLEX MICROSCOPIC - Abnormal; Notable for the following:    Leukocytes, UA TRACE (*)    All other components within normal limits  URINE MICROSCOPIC-ADD ON - Abnormal; Notable for the following:    Squamous Epithelial / LPF FEW (*)    Bacteria, UA FEW (*)    All other components within normal limits  COMPREHENSIVE METABOLIC PANEL  LIPASE, BLOOD  POC URINE PREG, ED   Imaging Review Ct Abdomen Pelvis W Contrast  10/28/2013   CLINICAL DATA:  Right upper abdominal pain. History of kidney stones.  EXAM: CT ABDOMEN AND PELVIS WITH CONTRAST  TECHNIQUE: Multidetector CT imaging of the abdomen and pelvis was performed using the standard protocol following bolus administration of intravenous contrast.  CONTRAST:  OMNIPAQUE IOHEXOL 300 MG/ML  SOLN  COMPARISON:  11/20/2012  FINDINGS: There is thickening of the wall of the distal ileum for approximately 30 cm in length extending to the terminal ileum and ileocecal valve. There is no significant mesenteric inflammation. There is several prominent ileocolic chain mesenteric lymph nodes, the largest measuring 9 mm in short axis.  The remainder of the small bowel is  normal. Normal appearance of the stomach. Appendix extends posteriorly and medially from the cecum. It is prominent measuring 6 mm in diameter, which is the top normal size range. There is no associated inflammation. This is felt to be a normal appendix.  No colonic wall thickening or inflammatory changes are seen. Rectum is unremarkable.  Clear lung bases.  The heart is normal in size.  Normal liver, spleen, gallbladder and pancreas. No bile duct dilation. No adrenal masses.  No definite intrarenal stones. No renal masses. No hydronephrosis. Ureters are normal in course and in caliber.  No ureteral stones. Normal bladder.  Normal uterus. Mild prominence of the right ovary from apparent 2.3 cm cyst. This is similar to the prior exam. Head neck is otherwise unremarkable.  No retroperitoneal adenopathy.  No ascites.  Mild degenerative changes are noted of the lumbar spine. No osteoblastic or osteolytic lesions.  IMPRESSION: 1. Wall thickening of the ileum for proximally 30 cm in length extending proximally from the ileocecal bowel. There is no significant mesenteric inflammation. There are prominent ileocolic lymph nodes. This is consistent with an infectious or inflammatory ileitis. 2. No renal or ureteral stones. No hydronephrosis or obstructive uropathy. 3. Normal appendix. 4. Exam otherwise unremarkable.   Electronically Signed   By: Amie Portland M.D.   On: 10/28/2013 17:04     EKG Interpretation None      MDM   Final diagnoses:  Abdominal pain    Patient is nontoxic, nonseptic appearing, in no apparent distress.  Patient's pain and other symptoms adequately managed in emergency department.  Fluid bolus given.  Labs, imaging and vitals reviewed.  Patient does not meet the SIRS or Sepsis criteria.  On repeat exam patient does not have a surgical abdomen and there are no peritoneal signs.  No indication of appendicitis, bowel obstruction, bowel perforation, cholecystitis, diverticulitis, PID or ectopic  pregnancy.  Patient discharged home with abx given CT findings and given strict instructions for follow-up with their primary care physician. She was given a referral to GI. I have also discussed reasons to return immediately to the ER.  Patient expresses understanding and agrees with plan. Discussed case with Dr. Rubin Payor who agrees with plan. Patient / Family / Caregiver informed of clinical course, understand medical decision-making process, and agree with plan.        Mora Bellman, PA-C 10/28/13 1720

## 2013-10-28 NOTE — Progress Notes (Signed)
This chart was scribed for Grace Long by Tana ConchStephen Methvin, ED Scribe. This patient was seen in room 13 and the patient's care was started at 12:22 PM .  Subjective:    Patient ID: Grace Long, female    DOB: 11/20/1990, 23 y.o.   MRN: [id number]  HPI    Review of Systems  Constitutional: Negative for appetite change.  Gastrointestinal: Positive for nausea and abdominal pain. Negative for vomiting and diarrhea.    HPI Comments: Grace Long is a 23 y.o. female [facility] of sharp, waxing and waning, epigastric abdominal pain that began at 7am. She reports that the pain feels like someone is "stabbing here". Pt reports associated nausea. Pt reports normal BM. Pt reports no h/o gallbladder problems. LNMP 3-4 months ago.   Pt h/o kidney stones last year.      Objective:   Physical Exam  HENT:  Head: Normocephalic and atraumatic.  Right Ear: External ear normal.  Left Ear: External ear normal.  Mouth/Throat: Oropharynx is clear and moist.  Cardiovascular: Normal rate, regular rhythm, normal heart sounds and intact distal pulses.   Pulmonary/Chest: Effort normal and breath sounds normal.  Abdominal: Bowel sounds are decreased. There is tenderness.  Decreased bowel sounds Tender mid up epgastrium and mild mid-upper abdomen.   Results for orders placed in visit on 10/28/13  POCT CBC      Result Value Ref Range   WBC 11.9 (*) 4.6 - 10.2 K/uL   Lymph, poc 2.2  0.6 - 3.4   POC LYMPH PERCENT 18.1  10 - 50 %L   MID (cbc) 0.6  0 - 0.9   POC MID % 5.0  0 - 12 %M   POC Granulocyte 9.2 (*) 2 - 6.9   Granulocyte percent 76.9  37 - 80 %G   RBC 4.54  4.04 - 5.48 M/uL   Hemoglobin 12.6  12.2 - 16.2 g/dL   HCT, POC 82.940.6  56.237.7 - 47.9 %   MCV 89.4  80 - 97 fL   MCH, POC 27.8  27 - 31.2 pg   MCHC 31.0 (*) 31.8 - 35.4 g/dL   RDW, POC 13.013.8     Platelet Count, POC 263  142 - 424 K/uL   MPV 11.6  0 - 99.8 fL  POCT URINE PREGNANCY      Result Value Ref Range   Preg Test, Ur Negative    POCT URINALYSIS DIPSTICK      Result Value Ref Range   Color, UA yellow     Clarity, UA hazy     Glucose, UA neg     Bilirubin, UA neg     Ketones, UA neg     Spec Grav, UA 1.025     Blood, UA neg     pH, UA 5.5     Protein, UA neg     Urobilinogen, UA 0.2     Nitrite, UA neg     Leukocytes, UA Negative    POCT UA - MICROSCOPIC ONLY      Result Value Ref Range   WBC, Ur, HPF, POC 0-1     RBC, urine, microscopic neg     Bacteria, U Microscopic neg     Mucus, UA positive     Epithelial cells, urine per micros 0-1     Crystals, Ur, HPF, POC neg     Casts, Ur, LPF, POC neg     Yeast, UA neg  Assessment & Plan:   Filed Vitals:   10/28/13 1143  BP: 100/60  Pulse: 105  Temp: 98.7 F (37.1 C)  Resp: 16   Patient with right upper abdominal pain and right mid abdominal pain being sent to the hospital for further evaluation due to an elevated white count with significant abdominal tenderness. Triage call at the hospital and the expecting to see the patient 12:31 PM-Discussed treatment plan which includes labs, UA with pt at bedside and pt agreed to plan.  I personally performed the services described in this documentation, which was scribed in my presence. The recorded information has been reviewed and is accurate.
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
## 2013-10-30 LAB — URINE CULTURE
Colony Count: NO GROWTH
Organism ID, Bacteria: NO GROWTH

## 2013-11-12 ENCOUNTER — Emergency Department (HOSPITAL_BASED_OUTPATIENT_CLINIC_OR_DEPARTMENT_OTHER): Payer: Managed Care, Other (non HMO)

## 2013-11-12 ENCOUNTER — Encounter (HOSPITAL_BASED_OUTPATIENT_CLINIC_OR_DEPARTMENT_OTHER): Payer: Self-pay | Admitting: Emergency Medicine

## 2013-11-12 ENCOUNTER — Emergency Department (HOSPITAL_BASED_OUTPATIENT_CLINIC_OR_DEPARTMENT_OTHER)
Admission: EM | Admit: 2013-11-12 | Discharge: 2013-11-12 | Disposition: A | Discharge status: Home / Self Care | Payer: Managed Care, Other (non HMO) | Attending: Emergency Medicine | Admitting: Emergency Medicine

## 2013-11-12 DIAGNOSIS — S335XXA Sprain of ligaments of lumbar spine, initial encounter: Secondary | ICD-10-CM | POA: Insufficient documentation

## 2013-11-12 DIAGNOSIS — Z792 Long term (current) use of antibiotics: Secondary | ICD-10-CM | POA: Insufficient documentation

## 2013-11-12 DIAGNOSIS — Y9389 Activity, other specified: Secondary | ICD-10-CM | POA: Insufficient documentation

## 2013-11-12 DIAGNOSIS — Y92410 Unspecified street and highway as the place of occurrence of the external cause: Secondary | ICD-10-CM | POA: Insufficient documentation

## 2013-11-12 DIAGNOSIS — S60229A Contusion of unspecified hand, initial encounter: Secondary | ICD-10-CM | POA: Insufficient documentation

## 2013-11-12 DIAGNOSIS — S39012A Strain of muscle, fascia and tendon of lower back, initial encounter: Secondary | ICD-10-CM

## 2013-11-12 MED ORDER — HYDROCODONE-ACETAMINOPHEN 5-325 MG PO TABS
2.0000 | ORAL_TABLET | Freq: Once | ORAL | Status: AC
  Administered 2013-11-12: 2 via ORAL
  Filled 2013-11-12: qty 2

## 2013-11-12 MED ORDER — HYDROCODONE-ACETAMINOPHEN 5-325 MG PO TABS: 2.0000 | ORAL_TABLET | ORAL | Status: DC | PRN

## 2013-11-12 MED ORDER — IBUPROFEN 800 MG PO TABS: 800.0000 mg | ORAL_TABLET | Freq: Three times a day (TID) | ORAL | Status: DC
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
## 2013-11-12 NOTE — Discharge Instructions (Signed)
Back Pain, Adult Low back pain is very common. About 1 in 5 people have back pain.The cause of low back pain is rarely dangerous. The pain often gets better over time.About half of people with a sudden onset of back pain feel better in just 2 weeks. About 8 in 10 people feel better by 6 weeks.  CAUSES Some common causes of back pain include:  Strain of the muscles or ligaments supporting the spine.  Wear and tear (degeneration) of the spinal discs.  Arthritis.  Direct injury to the back. DIAGNOSIS Most of the time, the direct cause of low back pain is not known.However, back pain can be treated effectively even when the exact cause of the pain is unknown.Answering your caregiver's questions about your overall health and symptoms is one of the most accurate ways to make sure the cause of your pain is not dangerous. If your caregiver needs more information, he or she may order lab work or imaging tests (X-rays or MRIs).However, even if imaging tests show changes in your back, this usually does not require surgery. HOME CARE INSTRUCTIONS For many people, back pain returns.Since low back pain is rarely dangerous, it is often a condition that people can learn to manageon their own.   Remain active. It is stressful on the back to sit or stand in one place. Do not sit, drive, or stand in one place for more than 30 minutes at a time. Take short walks on level surfaces as soon as pain allows.Try to increase the length of time you walk each day.  Do not stay in bed.Resting more than 1 or 2 days can delay your recovery.  Do not avoid exercise or work.Your body is made to move.It is not dangerous to be active, even though your back may hurt.Your back will likely heal faster if you return to being active before your pain is gone.  Pay attention to your body when you bend and lift. Many people have less discomfortwhen lifting if they bend their knees, keep the load close to their bodies,and  avoid twisting. Often, the most comfortable positions are those that put less stress on your recovering back.  Find a comfortable position to sleep. Use a firm mattress and lie on your side with your knees slightly bent. If you lie on your back, put a pillow under your knees.  Only take over-the-counter or prescription medicines as directed by your caregiver. Over-the-counter medicines to reduce pain and inflammation are often the most helpful.Your caregiver may prescribe muscle relaxant drugs.These medicines help dull your pain so you can more quickly return to your normal activities and healthy exercise.  Put ice on the injured area.  Put ice in a plastic bag.  Place a towel between your skin and the bag.  Leave the ice on for 15-20 minutes, 03-04 times a day for the first 2 to 3 days. After that, ice and heat may be alternated to reduce pain and spasms.  Ask your caregiver about trying back exercises and gentle massage. This may be of some benefit.  Avoid feeling anxious or stressed.Stress increases muscle tension and can worsen back pain.It is important to recognize when you are anxious or stressed and learn ways to manage it.Exercise is a great option. SEEK MEDICAL CARE IF:  You have pain that is not relieved with rest or medicine.  You have pain that does not improve in 1 week.  You have new symptoms.  You are generally not feeling well. SEEK   IMMEDIATE MEDICAL CARE IF:   You have pain that radiates from your back into your legs.  You develop new bowel or bladder control problems.  You have unusual weakness or numbness in your arms or legs.  You develop nausea or vomiting.  You develop abdominal pain.  You feel faint. Document Released: 07/09/2005 Document Revised: 01/08/2012 Document Reviewed: 11/27/2010 [employer] Patient Information 2014 [employer].  
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
## 2013-11-12 NOTE — ED Provider Notes (Signed)
Medical screening examination/treatment/procedure(s) were performed by non-physician practitioner and as supervising physician I was immediately available for consultation/collaboration.   EKG Interpretation   Date/Time:  Thursday November 12 2013 19:11:26 EDT Ventricular Rate:  81 PR Interval:  132 QRS Duration: 92 QT Interval:  362 QTC Calculation: 420 R Axis:   62 Text Interpretation:  Normal sinus rhythm with sinus arrhythmia Normal ECG  No old tracing to compare Confirmed by [facility]  MD, Nataleah Scioneaux [phone]) on  11/12/2013 7:35:20 PM        Leonette Mostharles B. Bernette MayersSheldon, MD 11/12/13 2259

## 2014-03-25 ENCOUNTER — Emergency Department (HOSPITAL_COMMUNITY)
Admission: EM | Admit: 2014-03-25 | Discharge: 2014-03-25 | Disposition: A | Discharge status: Home / Self Care | Payer: Managed Care, Other (non HMO) | Attending: Emergency Medicine | Admitting: Emergency Medicine

## 2014-03-25 ENCOUNTER — Encounter (HOSPITAL_COMMUNITY): Payer: Self-pay | Admitting: Emergency Medicine

## 2014-03-25 DIAGNOSIS — M436 Torticollis: Secondary | ICD-10-CM | POA: Diagnosis not present

## 2014-03-25 DIAGNOSIS — R51 Headache: Secondary | ICD-10-CM | POA: Insufficient documentation

## 2014-03-25 DIAGNOSIS — M542 Cervicalgia: Secondary | ICD-10-CM | POA: Insufficient documentation

## 2014-03-25 DIAGNOSIS — Z87828 Personal history of other (healed) physical injury and trauma: Secondary | ICD-10-CM | POA: Diagnosis not present

## 2014-03-25 MED ORDER — CYCLOBENZAPRINE HCL 10 MG PO TABS
10.0000 mg | ORAL_TABLET | Freq: Once | ORAL | Status: AC
  Administered 2014-03-25: 10 mg via ORAL
  Filled 2014-03-25: qty 1

## 2014-03-25 MED ORDER — IBUPROFEN 600 MG PO TABS: 600.0000 mg | ORAL_TABLET | Freq: Four times a day (QID) | ORAL | Status: DC | PRN

## 2014-03-25 MED ORDER — IBUPROFEN 800 MG PO TABS
800.0000 mg | ORAL_TABLET | Freq: Once | ORAL | Status: AC
  Administered 2014-03-25: 800 mg via ORAL
  Filled 2014-03-25: qty 1

## 2014-03-25 MED ORDER — CYCLOBENZAPRINE HCL 5 MG PO TABS: 5.0000 mg | ORAL_TABLET | Freq: Three times a day (TID) | ORAL | Status: DC | PRN

## 2014-03-25 NOTE — ED Notes (Signed)
Pt reporting pain in neck and shoulders starting yesterday morning upon awakening.  Reporting pain has continued and gets worse with turning head or lifting arms.

## 2014-03-25 NOTE — Discharge Instructions (Signed)
Torticollis, Acute You have suddenly (acutely) developed a twisted neck (torticollis). This is usually a self-limited condition. CAUSES  Acute torticollis may be caused by malposition, trauma or infection. Most commonly, acute torticollis is caused by sleeping in an awkward position. Torticollis may also be caused by the flexion, extension or twisting of the neck muscles beyond their normal position. Sometimes, the exact cause may not be known. SYMPTOMS  Usually, there is pain and limited movement of the neck. Your neck may twist to one side. DIAGNOSIS  The diagnosis is often made by physical examination. X-rays, CT scans or MRIs may be done if there is a history of trauma or concern of infection. TREATMENT  For a common, stiff neck that develops during sleep, treatment is focused on relaxing the contracted neck muscle. Medications (including shots) may be used to treat the problem. Most cases resolve in several days. Torticollis usually responds to conservative physical therapy. If left untreated, the shortened and spastic neck muscle can cause deformities in the face and neck. Rarely, surgery is required. HOME CARE INSTRUCTIONS   Use over-the-counter and prescription medications as directed by your caregiver.  Do stretching exercises and massage the neck as directed by your caregiver.  Follow up with physical therapy if needed and as directed by your caregiver. SEEK IMMEDIATE MEDICAL CARE IF:   You develop difficulty breathing or noisy breathing (stridor).  You drool, develop trouble swallowing or have pain with swallowing.  You develop numbness or weakness in the hands or feet.  You have changes in speech or vision.  You have problems with urination or bowel movements.  You have difficulty walking.  You have a fever.  You have increased pain. MAKE SURE YOU:   Understand these instructions.  Will watch your condition.  Will get help right away if you are not doing well or  get worse. Document Released: 07/06/2000 Document Revised: 10/01/2011 Document Reviewed: 08/17/2009 [facility] Patient Information 2015 [employer], [place]. This information is not intended to replace advice given to you by your health care provider. Make sure you discuss any questions you have with your health care provider.    Emergency Department Resource Guide 1) Find a Doctor and Pay Out of Pocket Although you won't have to find out who is covered by your insurance plan, it is a good idea to ask around and get recommendations. You will then need to call the office and see if the doctor you have chosen will accept you as a new patient and what types of options they offer for patients who are self-pay. Some doctors offer discounts or will set up payment plans for their patients who do not have insurance, but you will need to ask so you aren't surprised when you get to your appointment.  2) Contact Your Local Health Department Not all health departments have doctors that can see patients for sick visits, but many do, so it is worth a call to see if yours does. If you don't know where your local health department is, you can check in your phone book. The CDC also has a tool to help you locate your state's health department, and many state websites also have listings of all of their local health departments.  3) Find a Walk-in Clinic If your illness is not likely to be very severe or complicated, you may want to try a walk in clinic. These are popping up all over the country in pharmacies, drugstores, and shopping centers. They're usually staffed by nurse practitioners  or physician assistants that have been trained to treat common illnesses and complaints. They're usually fairly quick and inexpensive. However, if you have serious medical issues or chronic medical problems, these are probably not your best option.  No Primary Care Doctor: - Call Health Connect at  [phone] - they can help you locate a  primary care doctor that  accepts your insurance, provides certain services, etc. - Physician Referral Service- [phone]  Chronic Pain Problems: Organization         Address  Phone   Notes  [facility]  [phone] Patients need to be referred by their primary care doctor.   Medication Assistance: Organization         Address  Phone   Notes  [facility] [place] Medication [facility] [address] [place], [place] [address] [phone]) [phone] --Must be a resident of [facility] [place] -- Must have NO insurance coverage whatsoever (no Medicaid/ Medicare, etc.) -- The pt. MUST have a primary care doctor that directs their care regularly and follows them in the community   MedAssist  [phone]   [employer]  [phone]) [phone]    Agencies that provide inexpensive medical care: Organization         Address  Phone   Notes  [facility]  [phone]   [facility]    [phone]   [facility] [address] [place], [place] [address] [phone]) [phone]   [facility] [address], [place] [phone]) [phone]   [facility]anned Parenthood    [phone]   [facility]    [phone](336) [phone]   Community Health and [facility]  [address], [place] Phone:  [phone]) [phone], Fax:  [phone](336) [phone] Hours of Operation:  9 am - 6 pm, M-F.  Also accepts Medicaid/Medicare and self-pay.  [facility]  [address], [place] Phone: [phone](336) [phone], Fax: [phone](336) [phone]. Hours of Operation:  8:30 am - 5:30 pm, M-F.  Also accepts Medicaid and self-pay.  [facility] [place] [address], [place] Phone: [phone]) [phone]   [facility] [address], [place], [place] [phone], Ext. [phone] Mondays & Thursdays: 7-9 AM.  First 15 patients are seen on a first come, first serve basis.    Medicaid-accepting [facility]  Providers:  Organization         Address  Phone   Notes  [facility] [address], [place] [phone]) [phone] Also accepts self-pay patients.  [facility] [address], [place]  [phone](336) [phone]   [facility] [address], [place] [phone]) [phone]   [facility] [address] [place] [address], [place] [phone]) [phone]   Renaye RakersVeita Bland [address], [place]   [phone] Only accepts [place] Access [place] patients after they have their name applied to their card.   Self-Pay (no insurance) in [facility]:  Organization         Address  Phone   Notes  Sickle Cell Patients, [facility] [address], [place] [phone]) [phone]   [facility] [address], [place] [phone]) [phone]   [facility] [place]  [address], [place] [phone]) [phone]   [facility]/Dr. Julio Sickssei-Bonsu  [phone]  [place], [place] or [address], [place] [phone] Phone number for both [place] and [place] locations is the same.  [facility] [address] [place] [phone]   [facility] [address], [place] or [address] [phone]   [facility] [address] [place] [phone], phone; [phone], fax Sees patients 1st and 3rd Saturday of every month.  Must not qualify for public or private insurance (i.e. Medicaid, Medicare, [place] Health Choice, Veterans' Benefits)  Household income should be no more than 200% of the poverty level The clinic cannot treat you if you are pregnant or think you are pregnant  Sexually transmitted diseases are not treated at the clinic.    Dental Care: Organization         Address  Phone  Notes  [facility] Department of [facility] [address] [place], [place] [phone] Accepts children up to age 21 who are enrolled in [place] or [place] Health Choice; pregnant women with a Medicaid card; and children who have applied for Medicaid or [place] Health Choice, but were declined, whose parents can pay a reduced fee at time of service.  [facility]  [address], [place] [phone] Accepts children up to age 51 who are enrolled in [place] or [place] Health Choice; pregnant women with a Medicaid card; and children who have applied for Medicaid or [place] Health Choice, but were declined, whose parents can pay a reduced fee at time of service.  [facility]  [address] [place], [place] [phone] Patients are seen by appointment only. Walk-ins are not accepted. [facility] will see patients 78 years of age and older. Monday - Tuesday (8am-5pm) Most Wednesdays (8:30-5pm) $30 per visit, cash only  [facility] Access PROGRAM  [address], [facility] [phone] Patients are seen by appointment only. Walk-ins are not accepted. [facility] will see patients 40 years of age and older. One Wednesday Evening (Monthly: Volunteer Based).  $30 per visit, cash only  [employer]  [phone] for adults; Children under age 67, call Graduate Pediatric Dentistry at [phone]. Children aged 76-14, please call [phone] to request a pediatric application.  Dental services are provided in all areas of dental care including fillings, crowns and bridges, complete and partial dentures, implants, gum treatment, root canals, and extractions. Preventive care is also provided. Treatment is provided to both adults and children. Patients are selected via a lottery and there is often a waiting list.   [facility] [address] [place]  [phone] [email or web address]   [facility]  [address] [place], [place] [phone], Ext. [phone] Second and Fourth Thursday of each month, opens at 6:30 AM; Clinic ends at 9 AM.  Patients are seen on a first-come first-served basis, and a limited number are seen during each clinic.   [facility]  [address] [place], [place] [phone]   Eligibility Requirements You must have lived in [place], [place], or [place] counties for at least the last three months.   You cannot be eligible for state or federal sponsored [place], including [employer], [place], or [employer].   You generally cannot be eligible for healthcare insurance through your employer.  How to apply: Eligibility screenings are held every Tuesday and Wednesday afternoon from 1:00 pm until 4:00 pm. You do not need an appointment for the interview!  [facility] [address] [place], [place] [phone]   [facility] Health Department  [phone]   [facility]  [phone]   [facility]  [phone]    Behavioral Health Resources in the Community: Intensive Outpatient Programs Organization         Address  Phone  Notes  [facility] [address], [place], [place] [phone]   [facility] [address], [place], [place] [phone]   [facility]: Alcohol [facility] Drug Svcs [address] [place], [place]  [phone]   [facility] Mental Health [address],  [place], [place] [phone] or [phone]   Substance Abuse Resources Organization         Address  Phone  Notes  Alcohol [facility] Drug Services  [phone]   [facility]  [phone]   [facility] [place]  [phone]   Floydene Flock  [phone]   Residential & Outpatient Substance Abuse Program  [phone]   Psychological Services Organization         Address  Phone  Notes  [facility]al Health  [phone]    [facility]  [phone]   [facility] [address] [place] [phone] or [phone]    Mobile Crisis Teams Organization         Address  Phone  Notes  [facility], Mobile Crisis Care Unit  [phone]   [facility]  [address] [place], [place] [phone]   Doristine Locks [address] [place] [phone]    Self-Help/Support Groups Organization         Address  Phone             Notes  [facility] of [place] - variety of support groups  336- I7437963 Call for more information  Narcotics Anonymous (NA), Caring [facility] [address], [employer] [place]  2 meetings at this location   Statistician         Address  Phone  Notes  [facility] [address],    [place]  [phone]   [facility]  [address] [place] [address], [place], [place] [phone]   [facility] [address] [place], [place] [phone] Admissions: 8am-3pm M-F  [facility] [address]    [place], [place] [phone]   [facility] [address] [place], [place], [place] [phone]   [facility] [address]  [place], [place] [phone]   [facility] - Intensive Outpatient [address], [place], [place] [phone]   [facility] (Addiction Recovery [facility] [address]  [place], [place] [phone] or [phone]   [facility] ([facility]) [address] [place], [place] [phone] Accepts Medicaid  [facility] [address],  [place] [phone] Substance Abuse/Addiction Treatment   [facility] Organization         Address  Phone  Notes  [facility]  [phone]   Angie Fava, PhD [address] [place], [place]   [phone] or [phone]    [facility]   [address] [place], [place] [phone]   [facility] [address], [place], [place] [phone] Insurance/Medicaid/sponsorship through [employer] and Families [address]                                    [place], [place] [phone] Therapy/tele-psych/case  [facility] [address]   [place], [place] [phone]    Dr. Lolly Mustache  [phone]   [facility] [place]  United Way [facility]. 1) [address] [place] 2) [address], [place] 3)  [address], [place] [phone]  [phone]   [facility] Child Abuse Hotline 865-011-6668 or [phone] (After Hours)         Use the medicines prescribed.  Apply a heating pad for 20 minutes several times daily followed by range of motion stretches as discussed which should slowly help with pain and also help you retain your range of motion in your neck.  Please call Dr. Gerilyn Pilgrim for further evaluation if your fleeting headaches persist.  Your neurologic exam is normal today and there are no concerning symptoms for any dangerous conditions tonight.

## 2014-03-27 NOTE — ED Provider Notes (Signed)
CSN: [id number]     Arrival date & time 03/25/14  2108 History   First MD Initiated Contact with Patient 03/25/14 2134     Chief Complaint  Patient presents with  . Neck Pain     (Consider location/radiation/quality/duration/timing/severity/associated sxs/prior Treatment) The history is provided by the patient and the spouse.   Grace Long is a 23 y.o. female presenting with pain along her left neck and shoulder which she woke with yesterday morning.  She has decreased ability to rotate her neck in either direction which has worsened since the symptoms began.  She denies injuries and denies prior episodes of similar symptoms.  Her pain worsens with movement including lifting her arms.  She denies weakness or numbness in her extremities and has had no fevers or recent illnesses.  She has taken no medicines or treatment for this prior to arrival.  She also mentions having an intermittent right sided headache which has been present for the past 6-8 weeks.  She describes a sharp, fleeting stab of pain, lasting 1 second or less,  in the right parietal region occuring several times per day, and is triggered by bending over too quickly, but is not consistent triggered by this movement.  She denies dizziness, fevers, injury as mentioned above.     Past Medical History  Diagnosis Date  . Allergy    History reviewed. No pertinent past surgical history. Family History  Problem Relation Age of Onset  . Diabetes Maternal Grandmother    History  Substance Use Topics  . Smoking status: Never Smoker   . Smokeless tobacco: Not on file  . Alcohol Use: No   OB History   Grav Para Term Preterm Abortions TAB SAB Ect Mult Living                 Review of Systems  Constitutional: Negative for fever and chills.  HENT: Negative for congestion and sore throat.   Eyes: Negative.   Respiratory: Negative for chest tightness and shortness of breath.   Cardiovascular: Negative for chest pain.   Gastrointestinal: Negative for nausea and abdominal pain.  Genitourinary: Negative.   Musculoskeletal: Positive for neck pain and neck stiffness. Negative for arthralgias and joint swelling.  Skin: Negative.  Negative for rash and wound.  Neurological: Positive for headaches. Negative for dizziness, weakness, light-headedness and numbness.  Psychiatric/Behavioral: Negative.       Allergies  Review of patient's allergies indicates no known allergies.  Home Medications   Prior to Admission medications   Medication Sig Start Date End Date Taking? Authorizing Provider  cyclobenzaprine (FLEXERIL) 5 MG tablet Take 1 tablet (5 mg total) by mouth 3 (three) times daily as needed for muscle spasms. 03/25/14   Burgess Amor, PA-C  etonogestrel (NEXPLANON) 68 MG IMPL implant Inject 1 each into the skin once.    Historical Provider, MD  ibuprofen (ADVIL,MOTRIN) 600 MG tablet Take 1 tablet (600 mg total) by mouth every 6 (six) hours as needed. 03/25/14   Burgess Amor, PA-C   BP 131/67  Pulse 87  Temp(Src) 98.4 F (36.9 C) (Oral)  Resp 20  Ht  (1.727 m)  Wt 230 lb (104.327 kg)  BMI 34.98 kg/m2  SpO2 100% Physical Exam  Nursing note and vitals reviewed. Constitutional: She appears well-developed and well-nourished.  HENT:  Head: Normocephalic and atraumatic.  Right Ear: Tympanic membrane normal. No mastoid tenderness.  Left Ear: Tympanic membrane normal. No mastoid tenderness.  Nose: Nose normal.  Mouth/Throat: Uvula is  midline and oropharynx is clear and moist.  Eyes: Conjunctivae and EOM are normal. Pupils are equal, round, and reactive to light.  Neck: Muscular tenderness present. No spinous process tenderness present. No rigidity. Decreased range of motion present. No edema and no erythema present.  ttp bilateral trapezius along shoulders. Pain is triggered with c spine rom,  Left rotation worse than right with limited rom.  No pain with flex/hyperextension of the neck.  Cardiovascular:  Normal rate, regular rhythm, normal heart sounds and intact distal pulses.   Pulmonary/Chest: Effort normal and breath sounds normal. She has no wheezes.  Abdominal: Soft. Bowel sounds are normal. There is no tenderness.  Neurological: She is alert. She has normal strength. No cranial nerve deficit or sensory deficit. Coordination and gait normal. GCS eye subscore is 4. GCS verbal subscore is 5. GCS motor subscore is 6.  Equal grip strength.  Skin: Skin is warm and dry.  Psychiatric: She has a normal mood and affect.    ED Course  Procedures (including critical care time) Labs Review Labs Reviewed - No data to display  Imaging Review No results found.   EKG Interpretation None      MDM   Final diagnoses:  Torticollis, acute    Exam c/w acute torticollis.  Advised heat tx, followed by rom/stretching, demonstrated.  Prescribed flexeril, ibuprofen.  Fleeting headache which is chronic of unclear etiology.  She was given referral to Dr. Gerilyn Pilgrim for further evaluation of this condition.  Her neuro exam is unremarkable today.  No red flag signs of emergent condition.  The patient appears reasonably screened and/or stabilized for discharge and I doubt any other medical condition or other [facility] requiring further screening, evaluation, or treatment in the ED at this time prior to discharge.     Burgess Amor, PA-C 03/27/14 1447

## 2014-03-28 NOTE — ED Provider Notes (Signed)
Medical screening examination/treatment/procedure(s) were performed by non-physician practitioner and as supervising physician I was immediately available for consultation/collaboration.   EKG Interpretation None        Joniece Smotherman, DO 03/28/14 1646 

## 2014-12-30 ENCOUNTER — Encounter (HOSPITAL_COMMUNITY): Payer: Self-pay

## 2014-12-30 ENCOUNTER — Emergency Department (HOSPITAL_COMMUNITY)
Admission: EM | Admit: 2014-12-30 | Discharge: 2014-12-31 | Disposition: A | Discharge status: Home / Self Care | Payer: Managed Care, Other (non HMO) | Attending: Emergency Medicine | Admitting: Emergency Medicine

## 2014-12-30 ENCOUNTER — Emergency Department (HOSPITAL_COMMUNITY): Payer: Managed Care, Other (non HMO)

## 2014-12-30 ENCOUNTER — Ambulatory Visit: Payer: Managed Care, Other (non HMO) | Admitting: Emergency Medicine

## 2014-12-30 ENCOUNTER — Encounter: Payer: Self-pay | Admitting: Emergency Medicine

## 2014-12-30 VITALS — BP 120/90 | HR 104 | Temp 98.5°F | Resp 18 | Ht 68.0 in | Wt 284.2 lb

## 2014-12-30 DIAGNOSIS — Z79899 Other long term (current) drug therapy: Secondary | ICD-10-CM | POA: Diagnosis not present

## 2014-12-30 DIAGNOSIS — R000 Tachycardia, unspecified: Secondary | ICD-10-CM | POA: Insufficient documentation

## 2014-12-30 DIAGNOSIS — Z7982 Long term (current) use of aspirin: Secondary | ICD-10-CM | POA: Insufficient documentation

## 2014-12-30 DIAGNOSIS — G43109 Migraine with aura, not intractable, without status migrainosus: Secondary | ICD-10-CM | POA: Diagnosis not present

## 2014-12-30 DIAGNOSIS — E348 Other specified endocrine disorders: Secondary | ICD-10-CM | POA: Insufficient documentation

## 2014-12-30 DIAGNOSIS — G43909 Migraine, unspecified, not intractable, without status migrainosus: Secondary | ICD-10-CM

## 2014-12-30 DIAGNOSIS — R200 Anesthesia of skin: Secondary | ICD-10-CM | POA: Diagnosis present

## 2014-12-30 DIAGNOSIS — Z792 Long term (current) use of antibiotics: Secondary | ICD-10-CM | POA: Diagnosis not present

## 2014-12-30 HISTORY — DX: Migraine, unspecified, not intractable, without status migrainosus: G43.909

## 2014-12-30 MED ORDER — GADOBENATE DIMEGLUMINE 529 MG/ML IV SOLN
20.0000 mL | Freq: Once | INTRAVENOUS | Status: AC | PRN
  Administered 2014-12-30: 20 mL via INTRAVENOUS

## 2014-12-30 MED ORDER — METOCLOPRAMIDE HCL 10 MG PO TABS: 10.0000 mg | ORAL_TABLET | Freq: Three times a day (TID) | ORAL | Status: AC | PRN

## 2014-12-30 MED ORDER — KETOROLAC TROMETHAMINE 30 MG/ML IJ SOLN
30.0000 mg | Freq: Once | INTRAMUSCULAR | Status: AC
  Administered 2014-12-30: 30 mg via INTRAVENOUS
  Filled 2014-12-30: qty 1

## 2014-12-30 MED ORDER — SODIUM CHLORIDE 0.9 % IV BOLUS (SEPSIS)
1000.0000 mL | Freq: Once | INTRAVENOUS | Status: AC
  Administered 2014-12-30: 1000 mL via INTRAVENOUS

## 2014-12-30 MED ORDER — METOCLOPRAMIDE HCL 5 MG/ML IJ SOLN
10.0000 mg | Freq: Once | INTRAMUSCULAR | Status: AC
  Administered 2014-12-30: 10 mg via INTRAVENOUS
  Filled 2014-12-30: qty 2

## 2014-12-30 MED ORDER — DIPHENHYDRAMINE HCL 50 MG/ML IJ SOLN
25.0000 mg | Freq: Once | INTRAMUSCULAR | Status: AC
  Administered 2014-12-30: 25 mg via INTRAVENOUS
  Filled 2014-12-30: qty 1

## 2014-12-30 NOTE — Discharge Instructions (Signed)
Please call your doctor for a followup appointment within 24-48 hours. When you talk to your doctor please let them know that you were seen in the emergency department and have them acquire all of your records so that they can discuss the findings with you and formulate a treatment plan to fully care for your new and ongoing problems. ° °

## 2014-12-30 NOTE — ED Notes (Signed)
Pt complains of a migraine yesterday that went away and then came back slightly today with left arm numbness

## 2014-12-30 NOTE — Progress Notes (Signed)
Subjective:  Patient ID: Grace Long, female    DOB: 30-Apr-1991  Age: 24 y.o. MRN: [id number]  CC: Numbness; Neck Pain; and Migraine   HPI Grace Long presents  with severe headache over the last 36 hours she's had a migraine headache that responded to medication. She said she took Imitrex and the pain went away yesterday. Day she's had numbness in her left arm recurrent headache. He has no other neurological visual symptoms. She has no meningismus. No fever chills cough coryza or other complaints she's never had this neurologic symptoms associated with headaches in past.  History Grace Long has a past medical history of Allergy and Migraine.   She has no past surgical history on file.   Her  family history includes Diabetes in her maternal grandmother.  She   reports that she has never smoked. She does not have any smokeless tobacco history on file. She reports that she does not drink alcohol or use illicit drugs.  Outpatient Prescriptions Prior to Visit  Medication Sig Dispense Refill  . etonogestrel (NEXPLANON) 68 MG IMPL implant Inject 1 each into the skin once.    Marland Kitchen ibuprofen (ADVIL,MOTRIN) 600 MG tablet Take 1 tablet (600 mg total) by mouth every 6 (six) hours as needed. 30 tablet 0  . cyclobenzaprine (FLEXERIL) 5 MG tablet Take 1 tablet (5 mg total) by mouth 3 (three) times daily as needed for muscle spasms. (Patient not taking: Reported on 12/30/2014) 15 tablet 0   No facility-administered medications prior to visit.    History   Social History  . Marital Status: Single    Spouse Name: N/A  . Number of Children: N/A  . Years of Education: N/A   Social History Main Topics  . Smoking status: Never Smoker   . Smokeless tobacco: Not on file  . Alcohol Use: No  . Drug Use: No  . Sexual Activity:    Partners: Male    Birth Control/ Protection: Pill   Other Topics Concern  . None   Social History Narrative     Review of Systems  Constitutional: Negative for  fever, chills and appetite change.  HENT: Negative for congestion, ear pain, postnasal drip, sinus pressure and sore throat.   Eyes: Negative for pain and redness.  Respiratory: Negative for cough, shortness of breath and wheezing.   Cardiovascular: Negative for leg swelling.  Gastrointestinal: Negative for nausea, vomiting, abdominal pain, diarrhea, constipation and blood in stool.  Endocrine: Negative for polyuria.  Genitourinary: Negative for dysuria, urgency, frequency and flank pain.  Musculoskeletal: Negative for gait problem.  Skin: Negative for rash.  Neurological: Negative for weakness and headaches.  Psychiatric/Behavioral: Negative for confusion and decreased concentration. The patient is not nervous/anxious.     Objective:  BP 120/90 mmHg  Pulse 104  Temp(Src) 98.5 F (36.9 C) (Oral)  Resp 18  Ht  (1.727 m)  Wt 284 lb 4 oz (128.935 kg)  BMI 43.23 kg/m2  SpO2 98%  Physical Exam  H was alert and oriented 3 moving all extremities equally gross motor and cerebellar intact she  Assessment & Plan:   Grace Long was seen today for numbness, neck pain and migraine.  Diagnoses and all orders for this visit:  Migraine without status migrainosus, not intractable, unspecified migraine type   I am having Ms. Connelley maintain her etonogestrel, ibuprofen, cyclobenzaprine, SUMAtriptan, amphetamine-dextroamphetamine, and phentermine.  Meds ordered this encounter  Medications  . SUMAtriptan (IMITREX) 100 MG tablet    Sig: Take 100 mg  by mouth as needed.  Marland Kitchen amphetamine-dextroamphetamine (ADDERALL) 20 MG tablet    Sig: Take 20 mg by mouth 2 (two) times daily.  . phentermine (ADIPEX-P) 37.5 MG tablet    Sig: Take 37.5 mg by mouth daily.    Appropriate red flag conditions were discussed with the patient as well as actions that should be taken.  Patient expressed his understanding.  Due to the neurologic symptoms associated with his headache felt it prudent due to the hour  since gradually emergency room and for evaluation and imaging.   Follow-up: No Follow-up on file.  Grace Dane, MD

## 2014-12-30 NOTE — ED Notes (Signed)
Patient transported to MRI 

## 2014-12-30 NOTE — Patient Instructions (Signed)

## 2014-12-30 NOTE — ED Provider Notes (Signed)
CSN: [id number]     Arrival date & time 12/30/14  2058 History   First MD Initiated Contact with Patient 12/30/14 2115     Chief Complaint  Patient presents with  . Numbness     (Consider location/radiation/quality/duration/timing/severity/associated sxs/prior Treatment) HPI Comments: The patient is a 24 year old female, she has a history of migraine disorder, she has had headaches for many many years and states that her headaches are daily, occasionally they are severe at which time she thinks she has a migraine, she has been evaluated by her family doctor many times and has been placed on Imitrex which she states usually works to take her headaches. The headache that she is experiencing today started last night, it is posterior occipital location, sharp and stabbing, there is no radiation of the pain, it improved last night after Imitrex and went away, she woke this morning without a headache but it came back. The abnormal presentation of this headache is that she had associated left arm numbness which she states starts in the mid forearm and moves proximal up to the shoulder, also intermittent numbness in her left leg. She states that she feels some difficulty using her left hand to perform tasks, she is right-handed so it is difficult to know about fine motor movements. She denies fevers chills nausea vomiting difficulty ambulating, blurred vision, sore throat, cough, shortness of breath, chest pain, palpitations, back pain.  The history is provided by the patient.    Past Medical History  Diagnosis Date  . Allergy   . Migraine    History reviewed. No pertinent past surgical history. Family History  Problem Relation Age of Onset  . Diabetes Maternal Grandmother    History  Substance Use Topics  . Smoking status: Never Smoker   . Smokeless tobacco: Not on file  . Alcohol Use: No   OB History    No data available     Review of Systems  All other systems reviewed and are  negative.     Allergies  Review of patient's allergies indicates no known allergies.  Home Medications   Prior to Admission medications   Medication Sig Start Date End Date Taking? Authorizing Provider  amphetamine-dextroamphetamine (ADDERALL) 20 MG tablet Take 20 mg by mouth 2 (two) times daily. 09/17/14 09/18/15 Yes Historical Provider, MD  Aspirin-Caffeine (BC FAST PAIN RELIEF) 845-65 MG PACK Take 1 each by mouth every 6 (six) hours as needed (for pain).   Yes Historical Provider, MD  etonogestrel (NEXPLANON) 68 MG IMPL implant Inject 1 each into the skin once.   Yes Historical Provider, MD  metoCLOPramide (REGLAN) 10 MG tablet Take 1 tablet (10 mg total) by mouth 3 (three) times daily as needed for nausea (headache / nausea). 12/30/14   Eber Hong, MD   BP 114/80 mmHg  Pulse 139  Temp(Src) 98.9 F (37.2 C) (Oral)  Resp 20  Ht 5\' 8"  (1.727 m)  Wt 284 lb (128.822 kg)  BMI 43.19 kg/m2  SpO2 98% Physical Exam  Constitutional: She appears well-developed and well-nourished. No distress.  HENT:  Head: Normocephalic and atraumatic.  Mouth/Throat: Oropharynx is clear and moist. No oropharyngeal exudate.  Eyes: Conjunctivae and EOM are normal. Pupils are equal, round, and reactive to light. Right eye exhibits no discharge. Left eye exhibits no discharge. No scleral icterus.  Neck: Normal range of motion. Neck supple. No JVD present. No thyromegaly present.  Cardiovascular: Regular rhythm, normal heart sounds and intact distal pulses.  Exam reveals no gallop and  no friction rub.   No murmur heard. Mild tachycardia  Pulmonary/Chest: Effort normal and breath sounds normal. No respiratory distress. She has no wheezes. She has no rales.  Abdominal: Soft. Bowel sounds are normal. She exhibits no distension and no mass. There is no tenderness.  Musculoskeletal: Normal range of motion. She exhibits no edema or tenderness.  Lymphadenopathy:    She has no cervical adenopathy.  Neurological:  She is alert. Coordination normal.  Cranial nerves III through XII are normal, strength in all 4 extremities is normal, normal strength to flexion and extension at the elbows and wrists hips knees and ankles. She is able to straight leg raise bilaterally without difficulty, normal grips bilaterally without difficulty, sensation is decreased in the part of the arm from the mid forearm through the shoulder and on the left upper back over the scapula. Numbness is also present to the left anterior thigh, no numbness below the knee or on the posterior thigh. Speech is normal, memory is normal, coordination is normal, gait is normal.  Skin: Skin is warm and dry. No rash noted. No erythema.  Psychiatric: She has a normal mood and affect. Her behavior is normal.  Nursing note and vitals reviewed.   ED Course  Procedures (including critical care time) Labs Review Labs Reviewed - No data to display  Imaging Review No results found.   MDM   Final diagnoses:  Complicated migraine    The patient's neurologic exam is reassuring, her tachycardia has improved almost completely and now has a pulse of 105, will talk with neurology, treat for cough located migraine at this time.  D/w Neuro - recommends MRI to r/o MS, otherwise likely complicated migraine.   Change of shift - care signed out to Dr. Wilkie Aye to f/u the MRI results and disposition accordingly.  Meds given in ED:  Medications  ketorolac (TORADOL) 30 MG/ML injection 30 mg (30 mg Intravenous Given 12/30/14 2203)  metoCLOPramide (REGLAN) injection 10 mg (10 mg Intravenous Given 12/30/14 2202)  sodium chloride 0.9 % bolus 1,000 mL (1,000 mLs Intravenous New Bag/Given 12/30/14 2200)  diphenhydrAMINE (BENADRYL) injection 25 mg (25 mg Intravenous Given 12/30/14 2204)  gadobenate dimeglumine (MULTIHANCE) injection 20 mL (20 mLs Intravenous Contrast Given 12/30/14 2313)    New Prescriptions   METOCLOPRAMIDE (REGLAN) 10 MG TABLET    Take 1 tablet (10 mg  total) by mouth 3 (three) times daily as needed for nausea (headache / nausea).      Eber Hong, MD 12/30/14 [phone]

## 2014-12-31 NOTE — ED Provider Notes (Signed)
Signed out to be discharged if MRI negative. MRI is largely unremarkable. 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 Patient discharged in good condition.  Results for orders placed or performed during the hospital encounter of 10/28/13  CBC with Differential  Result Value Ref Range   WBC 11.9 (H) 4.0 - 10.5 K/uL   RBC 4.39 3.87 - 5.11 MIL/uL   Hemoglobin 12.2 12.0 - 15.0 g/dL   HCT 96.7 89.3 - 81.0 %   MCV 86.1 78.0 - 100.0 fL   MCH 27.8 26.0 - 34.0 pg   MCHC 32.3 30.0 - 36.0 g/dL   RDW 17.5 10.2 - 58.5 %   Platelets 243 150 - 400 K/uL   Neutrophils Relative % 84 (H) 43 - 77 %   Neutro Abs 9.9 (H) 1.7 - 7.7 K/uL   Lymphocytes Relative 9 (L) 12 - 46 %   Lymphs Abs 1.1 0.7 - 4.0 K/uL   Monocytes Relative 7 3 - 12 %   Monocytes Absolute 0.9 0.1 - 1.0 K/uL   Eosinophils Relative 0 0 - 5 %   Eosinophils Absolute 0.0 0.0 - 0.7 K/uL   Basophils Relative 0 0 - 1 %   Basophils Absolute 0.0 0.0 - 0.1 K/uL  Comprehensive metabolic panel  Result Value Ref Range   Sodium 138 137 - 147 mEq/L   Potassium 3.8 3.7 - 5.3 mEq/L   Chloride 102 96 - 112 mEq/L   CO2 23 19 - 32 mEq/L   Glucose, Bld 92 70 - 99 mg/dL   BUN 11 6 - 23 mg/dL   Creatinine, Ser 2.77 0.50 - 1.10 mg/dL   Calcium 9.4 8.4 - 82.4 mg/dL   Total Protein 7.7 6.0 - 8.3 g/dL   Albumin 3.7 3.5 - 5.2 g/dL   AST 13 0 - 37 U/L   ALT 11 0 - 35 U/L   Alkaline Phosphatase 61 39 - 117 U/L   Total Bilirubin 0.4 0.3 - 1.2 mg/dL   GFR calc non Af Amer >90 >90 mL/min   GFR calc Af Amer >90 >90 mL/min  Lipase, blood  Result Value Ref Range   Lipase 23 11 - 59 U/L  Urinalysis, Routine w reflex microscopic  Result Value Ref Range   Color, Urine YELLOW YELLOW   APPearance CLEAR CLEAR   Specific Gravity, Urine 1.024 1.005 - 1.030   pH 7.0 5.0 - 8.0   Glucose, UA NEGATIVE NEGATIVE mg/dL   Hgb urine dipstick NEGATIVE NEGATIVE   Bilirubin Urine NEGATIVE NEGATIVE   Ketones,  ur NEGATIVE NEGATIVE mg/dL   Protein, ur NEGATIVE NEGATIVE mg/dL   Urobilinogen, UA 1.0 0.0 - 1.0 mg/dL   Nitrite NEGATIVE NEGATIVE   Leukocytes, UA TRACE (A) NEGATIVE  Urine microscopic-add on  Result Value Ref Range   Squamous Epithelial / LPF FEW (A) RARE   WBC, UA 3-6 <3 WBC/hpf   Bacteria, UA FEW (A) RARE   Urine-Other MUCOUS PRESENT   POC Urine Pregnancy, ED  (If Pre-menopausal female) - do not order at [facility]  Result Value Ref Range   Preg Test, Ur NEGATIVE NEGATIVE   Mr Laqueta Jean Wo Contrast  12/30/2014   CLINICAL DATA:  Intermittent migraine beginning yesterday, LEFT arm numbness today. History migraines.  EXAM: MRI HEAD WITHOUT AND WITH CONTRAST  TECHNIQUE: Multiplanar, multiecho pulse sequences of the brain and surrounding structures were obtained without and with intravenous contrast.  CONTRAST:  17mL MULTIHANCE  GADOBENATE DIMEGLUMINE 529 MG/ML IV SOLN  COMPARISON:  None.  FINDINGS: Mildly motion degraded examination.  The ventricles and sulci are normal for patient's age. No abnormal parenchymal signal, mass lesions, mass effect. No abnormal parenchymal enhancement. No reduced diffusion to suggest acute ischemia nor hyperacute demyelination. No susceptibility artifact to suggest hemorrhage. 8 mm nonenhancing pineal cyst.  No abnormal extra-axial fluid collections. No extra-axial masses nor leptomeningeal enhancement. Normal major intracranial vascular flow voids seen at the skull base.  Ocular globes and orbital contents are unremarkable though not tailored for evaluation. No abnormal sellar expansion. Minimal paranasal sinus mucosal thickening with LEFT maxillary and sphenoid mucosal retention cysts. No suspicious calvarial bone marrow signal. No abnormal sellar expansion. Craniocervical junction maintained.  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   Electronically Signed   By: Awilda Metro M.D.   On: 12/30/2014 23:50      Shon Baton, MD 12/31/14 [phone]

## 2015-09-04 IMAGING — CT CT ABD-PELV W/ CM
2 of 3 series · 13 of 36 positions shown, 18 images · IV contrast (OMNIPAQUE 300)
Comparison: 11/20/2012

CLINICAL DATA: Right upper abdominal pain. History of kidney
stones.

EXAM:
CT ABDOMEN AND PELVIS WITH CONTRAST
TECHNIQUE: Multidetector CT imaging of the abdomen and pelvis was performed
using the standard protocol following bolus administration of
intravenous contrast.
CONTRAST:  100mL OMNIPAQUE IOHEXOL 300 MG/ML  SOLN

[Series 2: abd/pel with · axial · 0.74mm/px · z∈[+1050,+1480]mm · 12 of 100 slices shown, 16 images]
[im 9/100  soft-tissue]
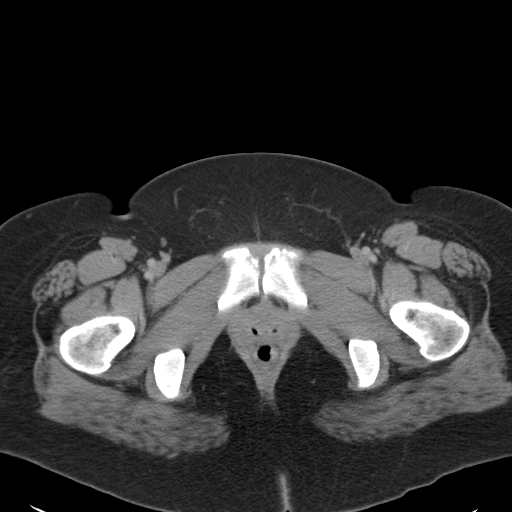
[im 9/100  bone]
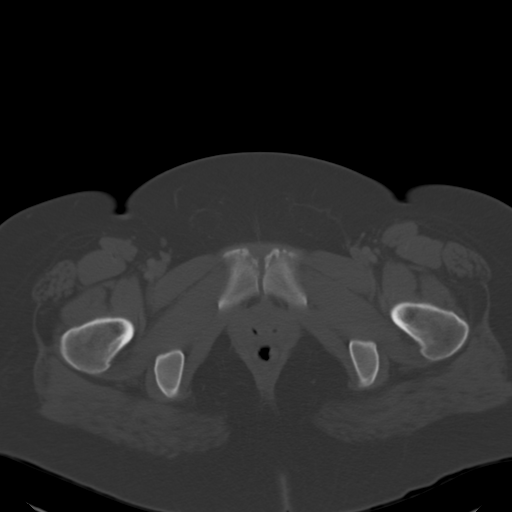
[im 17/100  soft-tissue]
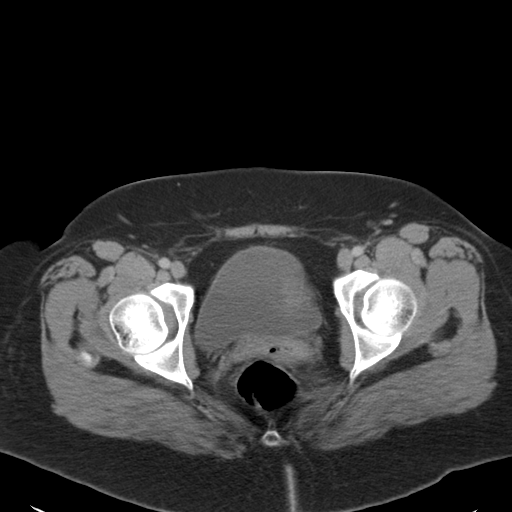
[im 25/100  soft-tissue]
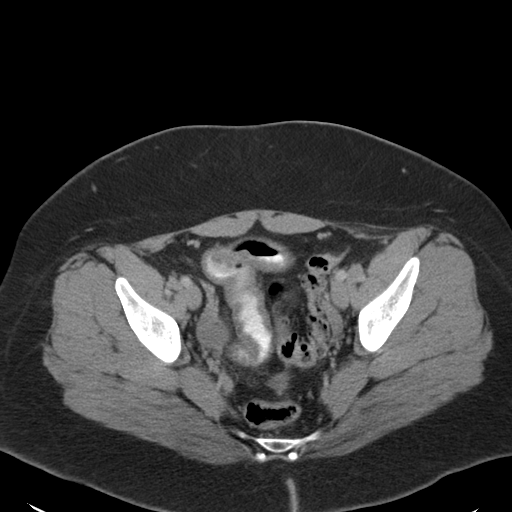
[im 38/100  soft-tissue]
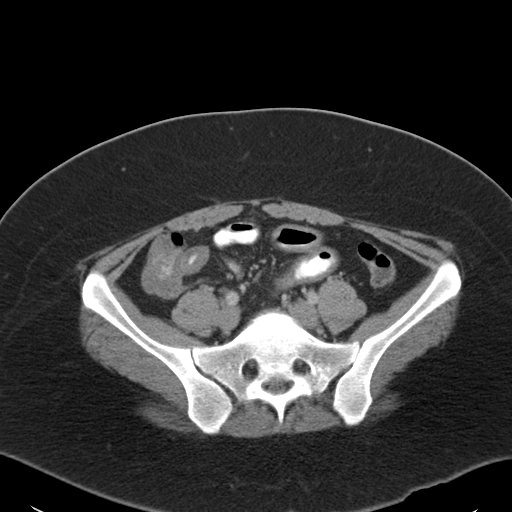
[im 46/100  soft-tissue]
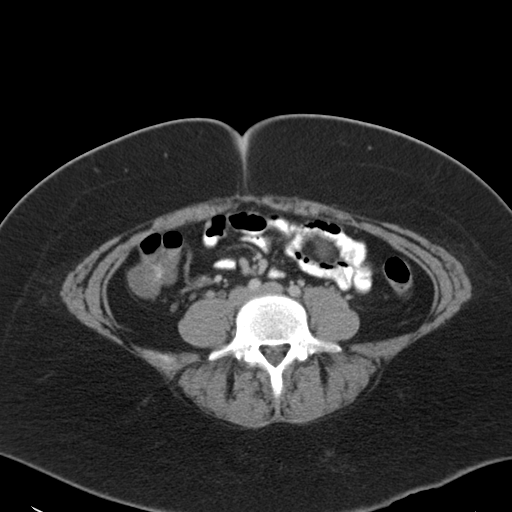
[im 54/100  soft-tissue]
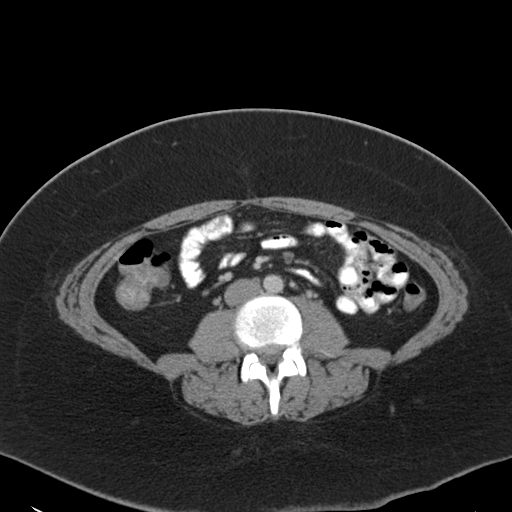
[im 62/100  soft-tissue]
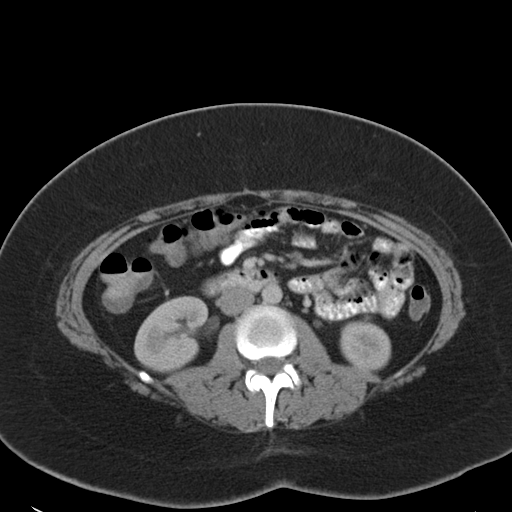
[im 75/100  soft-tissue]
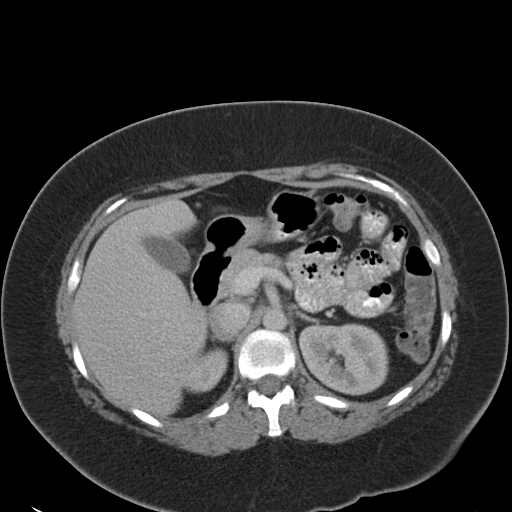
[im 83/100  soft-tissue]
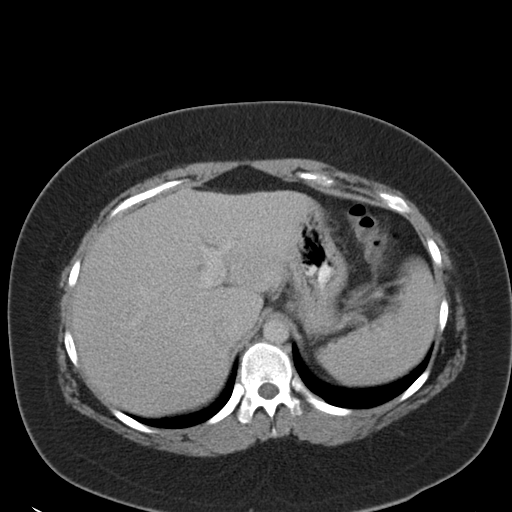
[im 83/100  lung]
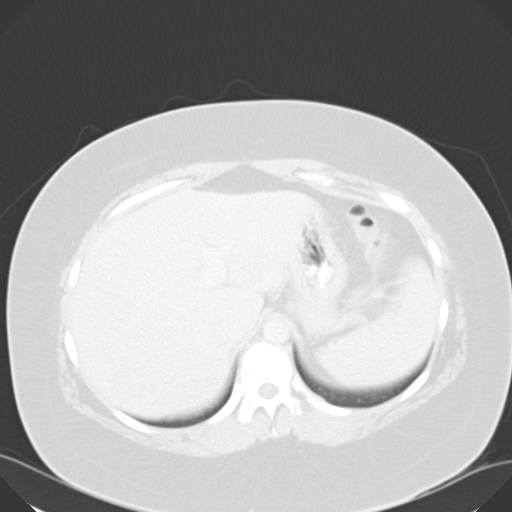
[im 83/100  bone]
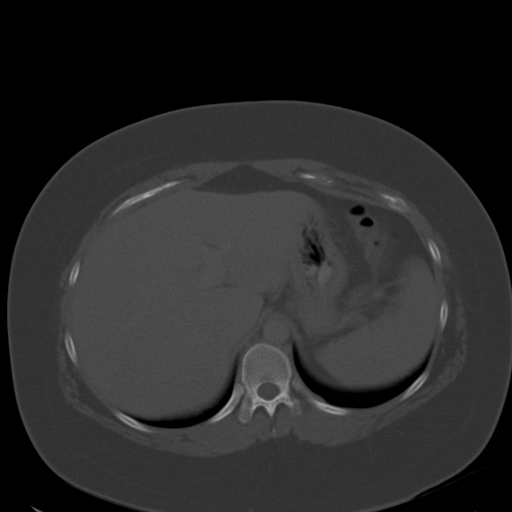
[im 87/100  lung]
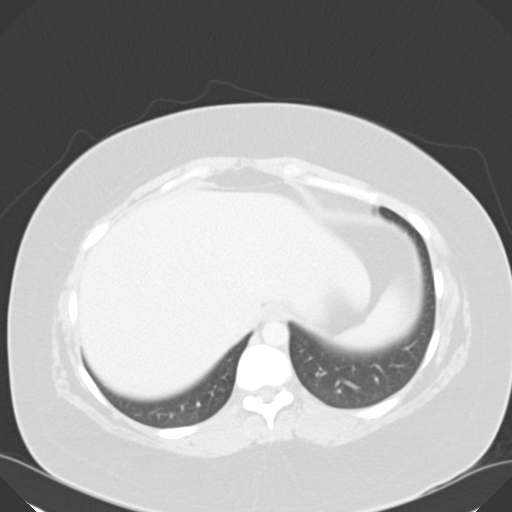
[im 91/100  soft-tissue]
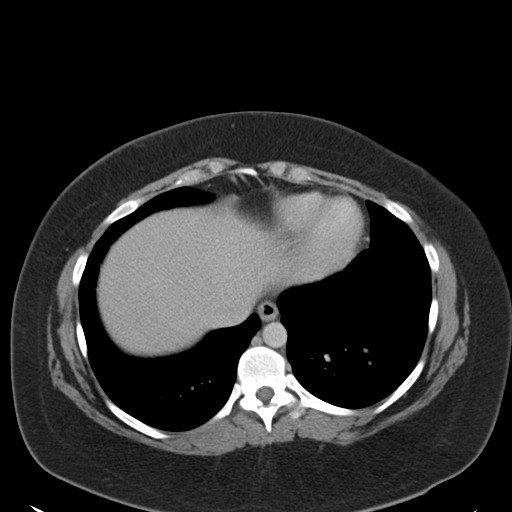
[im 91/100  lung]
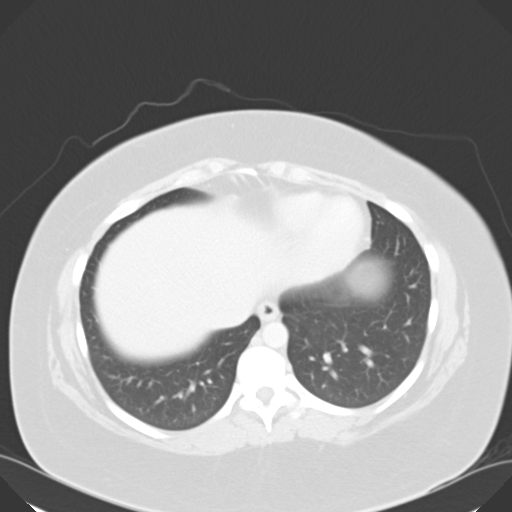
[im 95/100  lung]
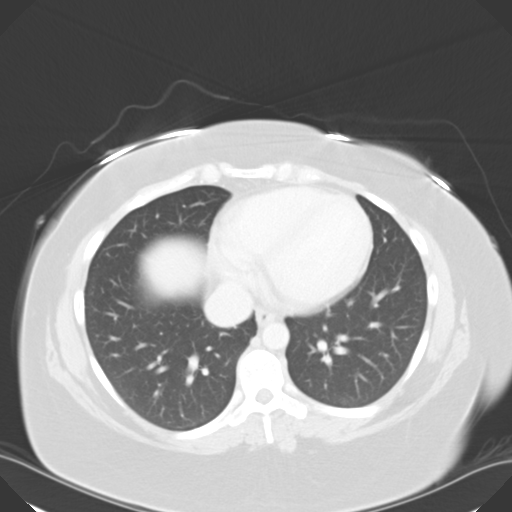

[Series 602: <mpr thick range> · sagittal · 0.97mm/px · 1 of 122 slices shown, 2 images]
[im 41/122  soft-tissue]
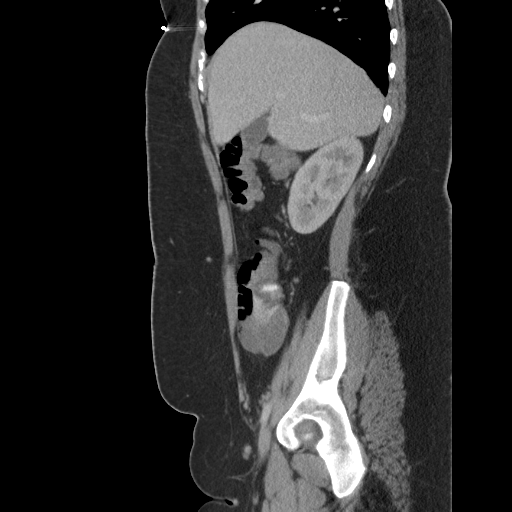
[im 41/122  bone]
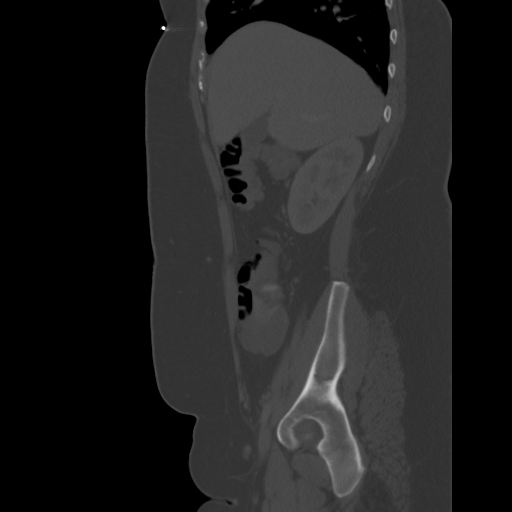

[13 of 36 positions shown; findings below may reference images not displayed]

FINDINGS: There is thickening of the wall of the distal ileum for
approximately 30 cm in length extending to the terminal ileum and
ileocecal valve. There is no significant mesenteric inflammation.
There is several prominent ileocolic chain mesenteric lymph nodes,
the largest measuring 9 mm in short axis.

The remainder of the small bowel is normal. Normal appearance of the
stomach. Appendix extends posteriorly and medially from the cecum.
It is prominent measuring 6 mm in diameter, which is the top normal
size range. There is no associated inflammation. This is felt to be
a normal appendix.

No colonic wall thickening or inflammatory changes are seen. Rectum
is unremarkable.

Clear lung bases.  The heart is normal in size.

Normal liver, spleen, gallbladder and pancreas. No bile duct
dilation. No adrenal masses.

No definite intrarenal stones. No renal masses. No hydronephrosis.
Ureters are normal in course and in caliber. No ureteral stones.
Normal bladder.

Normal uterus. Mild prominence of the right ovary from apparent
cm cyst. This is similar to the prior exam. Head neck is otherwise
unremarkable.

No retroperitoneal adenopathy.  No ascites.

Mild degenerative changes are noted of the lumbar spine. No
osteoblastic or osteolytic lesions.
IMPRESSION: 1. Wall thickening of the ileum for proximally 30 cm in length
extending proximally from the ileocecal bowel. There is no
significant mesenteric inflammation. There are prominent ileocolic
lymph nodes. This is consistent with an infectious or inflammatory
ileitis.
2. No renal or ureteral stones. No hydronephrosis or obstructive
uropathy.
3. Normal appendix.
4. Exam otherwise unremarkable.

## 2015-11-22 NOTE — Telephone Encounter (Signed)
error
